# Patient Record
Sex: Male | Born: 1995 | Race: Black or African American | Hispanic: No | Marital: Single | Smoking: Never smoker
Health system: Southern US, Community
[De-identification: ages and names within clinical notes are randomized; demographics above are authoritative.]

---
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
## 2016-09-28 ENCOUNTER — Ambulatory Visit (HOSPITAL_COMMUNITY)
Admission: RE | Admit: 2016-09-28 | Discharge: 2016-09-28 | Disposition: A | Discharge status: Home / Self Care | Payer: Self-pay | Attending: Psychiatry | Admitting: Psychiatry

## 2016-09-28 DIAGNOSIS — F333 Major depressive disorder, recurrent, severe with psychotic symptoms: Secondary | ICD-10-CM | POA: Insufficient documentation

## 2016-09-29 ENCOUNTER — Emergency Department (HOSPITAL_COMMUNITY)
Admission: EM | Admit: 2016-09-29 | Discharge: 2016-09-29 | Disposition: A | Discharge status: Other Acute Inpatient Hospital | Payer: 59 | Source: Home / Self Care | Attending: Emergency Medicine | Admitting: Emergency Medicine

## 2016-09-29 ENCOUNTER — Encounter (HOSPITAL_COMMUNITY): Payer: Self-pay | Admitting: *Deleted

## 2016-09-29 ENCOUNTER — Inpatient Hospital Stay (HOSPITAL_COMMUNITY)
Admission: AD | Admit: 2016-09-29 | Discharge: 2016-10-03 | DRG: 885 | Disposition: A | Discharge status: Home / Self Care | Payer: 59 | Source: Intra-hospital | Attending: Psychiatry | Admitting: Psychiatry

## 2016-09-29 ENCOUNTER — Encounter (HOSPITAL_COMMUNITY): Payer: Self-pay

## 2016-09-29 DIAGNOSIS — G4700 Insomnia, unspecified: Secondary | ICD-10-CM | POA: Diagnosis present

## 2016-09-29 DIAGNOSIS — F332 Major depressive disorder, recurrent severe without psychotic features: Principal | ICD-10-CM | POA: Diagnosis present

## 2016-09-29 DIAGNOSIS — F419 Anxiety disorder, unspecified: Secondary | ICD-10-CM | POA: Diagnosis present

## 2016-09-29 DIAGNOSIS — R45851 Suicidal ideations: Secondary | ICD-10-CM

## 2016-09-29 DIAGNOSIS — F329 Major depressive disorder, single episode, unspecified: Secondary | ICD-10-CM | POA: Diagnosis present

## 2016-09-29 DIAGNOSIS — F4310 Post-traumatic stress disorder, unspecified: Secondary | ICD-10-CM | POA: Diagnosis present

## 2016-09-29 LAB — COMPREHENSIVE METABOLIC PANEL
ALT: 20 U/L (ref 17–63)
AST: 31 U/L (ref 15–41)
Albumin: 4.5 g/dL (ref 3.5–5.0)
Alkaline Phosphatase: 72 U/L (ref 38–126)
Anion gap: 8 (ref 5–15)
BILIRUBIN TOTAL: 0.8 mg/dL (ref 0.3–1.2)
BUN: 30 mg/dL — AB (ref 6–20)
CO2: 26 mmol/L (ref 22–32)
CREATININE: 0.99 mg/dL (ref 0.61–1.24)
Calcium: 9.5 mg/dL (ref 8.9–10.3)
Chloride: 103 mmol/L (ref 101–111)
Glucose, Bld: 91 mg/dL (ref 65–99)
Potassium: 4.2 mmol/L (ref 3.5–5.1)
Sodium: 137 mmol/L (ref 135–145)
TOTAL PROTEIN: 7.8 g/dL (ref 6.5–8.1)

## 2016-09-29 LAB — CBC
HCT: 41.4 % (ref 39.0–52.0)
Hemoglobin: 13.6 g/dL (ref 13.0–17.0)
MCH: 25.5 pg — AB (ref 26.0–34.0)
MCHC: 32.9 g/dL (ref 30.0–36.0)
MCV: 77.7 fL — AB (ref 78.0–100.0)
PLATELETS: 207 10*3/uL (ref 150–400)
RBC: 5.33 MIL/uL (ref 4.22–5.81)
RDW: 13 % (ref 11.5–15.5)
WBC: 4.7 10*3/uL (ref 4.0–10.5)

## 2016-09-29 LAB — RAPID URINE DRUG SCREEN, HOSP PERFORMED
AMPHETAMINES: NOT DETECTED
Barbiturates: NOT DETECTED
Benzodiazepines: NOT DETECTED
Cocaine: NOT DETECTED
Opiates: NOT DETECTED
Tetrahydrocannabinol: NOT DETECTED

## 2016-09-29 LAB — ACETAMINOPHEN LEVEL: Acetaminophen (Tylenol), Serum: <10 ug/mL — ABNORMAL LOW (ref 10–30)

## 2016-09-29 LAB — SALICYLATE LEVEL: Salicylate Lvl: <7 mg/dL (ref 2.8–30.0)

## 2016-09-29 LAB — ETHANOL

## 2016-09-29 MED ORDER — MAGNESIUM HYDROXIDE 400 MG/5ML PO SUSP: 30.0000 mL | Freq: Every day | ORAL | Status: DC | PRN

## 2016-09-29 MED ORDER — TRAZODONE HCL 50 MG PO TABS
50.0000 mg | ORAL_TABLET | Freq: Every evening | ORAL | Status: DC | PRN
  Administered 2016-09-29 – 2016-10-02 (×4): 50 mg via ORAL
  Filled 2016-09-29 (×4): qty 1

## 2016-09-29 MED ORDER — ALUM & MAG HYDROXIDE-SIMETH 200-200-20 MG/5ML PO SUSP: 30.0000 mL | ORAL | Status: DC | PRN

## 2016-09-29 MED ORDER — ONDANSETRON HCL 4 MG PO TABS: 4.0000 mg | ORAL_TABLET | Freq: Three times a day (TID) | ORAL | Status: DC | PRN

## 2016-09-29 MED ORDER — ACETAMINOPHEN 325 MG PO TABS
650.0000 mg | ORAL_TABLET | ORAL | Status: DC | PRN
  Administered 2016-09-29: 650 mg via ORAL
  Filled 2016-09-29: qty 2

## 2016-09-29 MED ORDER — FLUOXETINE HCL 10 MG PO CAPS
10.0000 mg | ORAL_CAPSULE | Freq: Every day | ORAL | Status: DC
  Administered 2016-09-29: 10 mg via ORAL
  Filled 2016-09-29: qty 1

## 2016-09-29 MED ORDER — ACETAMINOPHEN 325 MG PO TABS: 650.0000 mg | ORAL_TABLET | ORAL | Status: DC | PRN

## 2016-09-29 MED ORDER — IBUPROFEN 200 MG PO TABS: 600.0000 mg | ORAL_TABLET | Freq: Three times a day (TID) | ORAL | Status: DC | PRN

## 2016-09-29 MED ORDER — IBUPROFEN 600 MG PO TABS: 600.0000 mg | ORAL_TABLET | Freq: Three times a day (TID) | ORAL | Status: DC | PRN

## 2016-09-29 MED ORDER — FLUOXETINE HCL 10 MG PO CAPS
10.0000 mg | ORAL_CAPSULE | Freq: Every day | ORAL | Status: DC
  Administered 2016-09-30 – 2016-10-03 (×4): 10 mg via ORAL
  Filled 2016-09-29 (×6): qty 1

## 2016-09-29 MED ORDER — TRAZODONE HCL 50 MG PO TABS: 50.0000 mg | ORAL_TABLET | Freq: Every evening | ORAL | Status: DC | PRN

## 2016-09-29 MED ORDER — ZOLPIDEM TARTRATE 5 MG PO TABS: 5.0000 mg | ORAL_TABLET | Freq: Every evening | ORAL | Status: DC | PRN

## 2016-09-29 NOTE — Progress Notes (Signed)
D: Patient denies SI/HI and A/V hallucinations; patient reports feeling sad; patient reports " I came because my sister made me"; patient reports I don't sleep at night, and I have thoughts to hurt myself at night  A: Admission completed; fluids and food offered  R: Patient sad and quiet; eye contact is brief; patient ate dinner; patient contracts for safety

## 2016-09-29 NOTE — ED Notes (Signed)
Tom CSW into see 

## 2016-09-29 NOTE — BH Assessment (Signed)
[facility] Assessment Progress Note  Per Thedore Mins, MD, this pt requires psychiatric hospitalization at this time.  Malva Limes, RN, [facility] has assigned pt to [facility] Rm 400-1; they will be ready to receive pt at 16:00.  Pt has signed Voluntary Admission and Consent for Treatment, as well as Consent to Release Information to his father and his sister, and signed forms have been faxed to [facility].  Pt's nurse, Wille Celeste, has been notified, and agrees to send original paperwork along with pt via Juel Burrow, and to call report to [phone].  Doylene Canning, MA Triage Specialist [phone]

## 2016-09-29 NOTE — H&P (Signed)
Behavioral Health Medical Screening Exam  Robert Sosa is an 21 y.o. male accompanied with his sister and father requesting psychiatric evaluation due to pervasive, exacerbated depressive symptoms, crying, anhedonia, hypersomnia and lack of motivation. Patient is denying nay specific triggers. He is endorsing SI with plan, intent and means. He is denying any HI, AVH, paranoia or delusional thoughts. He is denying acute and chronic co-morbid conditions.  Total Time spent with patient: 20 minutes  Psychiatric Specialty Exam: Physical Exam  Constitutional: He is oriented to person, place, and time. He appears well-developed and well-nourished.  HENT:  Head: Normocephalic.  Respiratory: Effort normal and breath sounds normal.  Neurological: He is alert and oriented to person, place, and time. No cranial nerve deficit.  Skin: Skin is warm and dry.  Psychiatric: His speech is normal. Cognition and memory are impaired. He expresses impulsivity. He exhibits a depressed mood. He expresses suicidal ideation. He expresses suicidal plans.    Review of Systems  Psychiatric/Behavioral: Positive for depression, substance abuse and suicidal ideas. Negative for hallucinations. The patient is not nervous/anxious and does not have insomnia.   All other systems reviewed and are negative.   There were no vitals taken for this visit.There is no height or weight on file to calculate BMI.  General Appearance: Casual  Eye Contact:  Minimal  Speech:  Clear and Coherent  Volume:  Decreased  Mood:  Depressed  Affect:  Congruent  Thought Process:  Goal Directed  Orientation:  Full (Time, Place, and Person)  Thought Content:  Negative  Suicidal Thoughts:  Yes.  with intent/plan  Homicidal Thoughts:  No  Memory:  Immediate;   Fair  Judgement:  Poor  Insight:  Lacking  Psychomotor Activity:  Negative  Concentration: Concentration: Good  Recall:  Fair  Fund of Knowledge:Fair  Language: Good  Akathisia:   Negative  Handed:  Right  AIMS (if indicated):     Assets:  Desire for Improvement  Sleep:       Musculoskeletal: Strength & Muscle Tone: within normal limits Gait & Station: normal Patient leans: N/A  There were no vitals taken for this visit.  Recommendations:  Based on my evaluation the patient does not appear to have an emergency medical condition.  Kerry Hough, PA-C 09/29/2016, 12:21 AM

## 2016-09-29 NOTE — Tx Team (Addendum)
Initial Treatment Plan 09/29/2016 6:09 PM Acheron Sugg ZOX:[id number]    PATIENT STRESSORS: Other: sleep  Suicidal Ideation   PATIENT STRENGTHS: Average or above average intelligence General fund of knowledge Physical Health Supportive family/friends Work skills   PATIENT IDENTIFIED PROBLEMS: "Suicidal Ideation"  "insomnia"  "anxiety"                 DISCHARGE CRITERIA:  Improved stabilization in mood, thinking, and/or behavior Need for constant or close observation no longer present  PRELIMINARY DISCHARGE PLAN: Return to previous living arrangement Return to previous work or school arrangements  PATIENT/FAMILY INVOLVEMENT: This treatment plan has been presented to and reviewed with the patient, Robert Sosa.  The patient and family have been given the opportunity to ask questions and make suggestions.  Barton Fanny, RN 09/29/2016, 6:09 PM

## 2016-09-29 NOTE — ED Notes (Signed)
Visitor into see 

## 2016-09-29 NOTE — ED Notes (Signed)
Pt presents with SI, plan and to hang self with rope.  Pt admits to auditory hallucinations.  Denies HI or visual hallucinations.  Pt reports he works to keep himself busy.  Complaint of depression in the past.  A&O x 3, no distress noted, calm & cooperative.  Monitoring for safety, Q 15 min checks in effect.  Safety check for contraband completed, no items found.

## 2016-09-29 NOTE — Progress Notes (Signed)
Nursing Progress Note: 7p-7a D: Pt currently presents with a depressed/sad affect and behavior. Pt states "I only have thoughts of hurting myself only when I can't sleep." Interacting appropriately with milieu. Pt reports off and on sleep at home.   A: Pt provided with medications per providers orders. Pt's labs and vitals were monitored throughout the night. Pt supported emotionally and encouraged to express concerns and questions. Pt educated on medications.  R: Pt's safety ensured with 15 minute and environmental checks. Pt currently denies SI/HI/Self Harm and AVH. Pt verbally contracts to seek staff if SI/HI or A/VH occurs and to consult with staff before acting on any harmful thoughts. Will continue to monitor.
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
## 2016-09-29 NOTE — Progress Notes (Signed)
[facility] Group Notes:  (Nursing/MHT/Case Management/Adjunct)  Date:  09/29/2016  Time:  9:53 PM  Type of Therapy:  Psychoeducational Skills  Participation Level:  Minimal  Participation Quality:  Inattentive  Affect:  Blunted  Cognitive:  Appropriate  Insight:  Good  Engagement in Group:  Limited  Modes of Intervention:  Education  Summary of Progress/Problems: The patient expressed in group that his father called him today but would not divulge any additional details about his day. His goal for tomorrow is to address his anger issues.   Hazle Coca S 09/29/2016, 9:53 PM

## 2016-09-29 NOTE — ED Notes (Signed)
On the phone 

## 2016-09-29 NOTE — ED Provider Notes (Signed)
[facility]-EMERGENCY DEPT Provider Note   CSN: [id number] Arrival date & time: 09/29/16  0105     History   Chief Complaint Chief Complaint  Patient presents with  . Suicidal    HPI Robert Sosa is a 21 y.o. male.  HPI Patient presents to the emergency department with suicidal ideation.  The patient attempted to hang himself yesterday.  He states that he has been dealing with these thoughts for quite a while, but cannot give me a specific timeframe.  He also has a history of depression that has been ongoing for quite a while.  Patient denies any hallucinationsThe patient denies chest pain, shortness of breath, headache,blurred vision, neck pain, fever, cough, weakness, numbness, dizziness, anorexia, edema, abdominal pain, nausea, vomiting, diarrhea, rash, back pain, dysuria, hematemesis, bloody stool, near syncope, or syncope.  Patient states that he is not on any medications for anxiety or depression History reviewed. No pertinent past medical history.  There are no active problems to display for this patient.   History reviewed. No pertinent surgical history.     Home Medications    Prior to Admission medications   Medication Sig Start Date End Date Taking? Authorizing Provider  ibuprofen (ADVIL,MOTRIN) 600 MG tablet Take 1 tablet (600 mg total) by mouth every 6 (six) hours as needed for pain. Patient not taking: Reported on 09/29/2016 10/24/12   Elson Areas, PA-C    Family History History reviewed. No pertinent family history.  Social History Social History  Substance Use Topics  . Smoking status: Never Smoker  . Smokeless tobacco: Not on file  . Alcohol use No     Allergies   Patient has no known allergies.   Review of Systems Review of Systems All other systems negative except as documented in the HPI. All pertinent positives and negatives as reviewed in the HPI.  Physical Exam Updated Vital Signs BP 121/68 (BP Location: Left Arm)   Pulse (!) 53   Temp  98.2 F (36.8 C) (Oral)   Resp 18   SpO2 100%   Physical Exam  Constitutional: He is oriented to person, place, and time. He appears well-developed and well-nourished. No distress.  HENT:  Head: Normocephalic and atraumatic.  Mouth/Throat: Oropharynx is clear and moist.  Eyes: Pupils are equal, round, and reactive to light.  Neck: Normal range of motion. Neck supple.  Cardiovascular: Normal rate, regular rhythm and normal heart sounds.  Exam reveals no gallop and no friction rub.   No murmur heard. Pulmonary/Chest: Effort normal and breath sounds normal. No respiratory distress. He has no wheezes.  Abdominal: Soft. Bowel sounds are normal. He exhibits no distension. There is no tenderness.  Neurological: He is alert and oriented to person, place, and time. He exhibits normal muscle tone. Coordination normal.  Skin: Skin is warm and dry. Capillary refill takes less than 2 seconds. No rash noted. No erythema.  Psychiatric: He has a normal mood and affect. His behavior is normal.  Nursing note and vitals reviewed.    ED Treatments / Results  Labs (all labs ordered are listed, but only abnormal results are displayed) Labs Reviewed  COMPREHENSIVE METABOLIC PANEL - Abnormal; Notable for the following:       Result Value   BUN 30 (*)    All other components within normal limits  ACETAMINOPHEN LEVEL - Abnormal; Notable for the following:    Acetaminophen (Tylenol), Serum <10 (*)    All other components within normal limits  CBC - Abnormal; Notable for  the following:    MCV 77.7 (*)    MCH 25.5 (*)    All other components within normal limits  ETHANOL  SALICYLATE LEVEL  RAPID URINE DRUG SCREEN, HOSP PERFORMED    EKG  EKG Interpretation None       Radiology No results found.  Procedures Procedures (including critical care time)  Medications Ordered in ED Medications  acetaminophen (TYLENOL) tablet 650 mg (not administered)  ibuprofen (ADVIL,MOTRIN) tablet 600 mg (not  administered)  zolpidem (AMBIEN) tablet 5 mg (not administered)  ondansetron (ZOFRAN) tablet 4 mg (not administered)  alum & mag hydroxide-simeth (MAALOX/MYLANTA) 200-200-20 MG/5ML suspension 30 mL (not administered)     Initial Impression / Assessment and Plan / ED Course  I have reviewed the triage vital signs and the nursing notes.  Pertinent labs & imaging results that were available during my care of the patient were reviewed by me and considered in my medical decision making (see chart for details).     Patient will need TTS assessment based on the fact that he wanted to hang himself and has what sounds like undiagnosed depression  Final Clinical Impressions(s) / ED Diagnoses   Final diagnoses:  None    New Prescriptions New Prescriptions   No medications on file     Charlestine Night, PA-C 09/29/16 1610    Zadie Rhine, MD 10/01/16 [phone]

## 2016-09-29 NOTE — ED Notes (Signed)
Pt ambulatory w/o difficulty with pehlam to [facility], belongings given to driver.

## 2016-09-29 NOTE — ED Notes (Signed)
Up to the bathroom 

## 2016-09-29 NOTE — ED Triage Notes (Signed)
Pt reports having thoughts of using a rope to hang himself yesterday. Pt reports increased SI. Pt report AH. Pt denies VH. Pt denies HI. Pt A+OX4, cooperative, speaking in complete sentences accompanied by father.

## 2016-09-29 NOTE — ED Notes (Signed)
Pt's sister into see 

## 2016-09-29 NOTE — Progress Notes (Signed)
09/29/16 1354:  LRT went to pt room to offer activities, pt was sleep.  Sofia Jaquith, LRT/CTRS 

## 2016-09-29 NOTE — ED Notes (Signed)
Bed: WTR5 Expected date:  Expected time:  Means of arrival:  Comments: 

## 2016-09-30 DIAGNOSIS — F332 Major depressive disorder, recurrent severe without psychotic features: Principal | ICD-10-CM

## 2016-09-30 MED ORDER — ENSURE ENLIVE PO LIQD
237.0000 mL | Freq: Two times a day (BID) | ORAL | Status: DC
  Administered 2016-10-01: 237 mL via ORAL

## 2016-09-30 MED ORDER — HYDROXYZINE HCL 25 MG PO TABS: 25.0000 mg | ORAL_TABLET | Freq: Four times a day (QID) | ORAL | Status: DC | PRN

## 2016-09-30 NOTE — Progress Notes (Signed)
The patient attended the [facility] group and was appropriate.

## 2016-09-30 NOTE — Tx Team (Signed)
Interdisciplinary Treatment and Diagnostic Plan Update  09/30/2016 Time of Session: 0930 Robert Sosa MRN: [id number]  Principal Diagnosis: MDD recurrent, severe  Secondary Diagnoses: Active Problems:   Major depressive disorder, recurrent severe without psychotic features (HCC)   Current Medications:  Current Facility-Administered Medications  Medication Dose Route Frequency Provider Last Rate Last Dose  . acetaminophen (TYLENOL) tablet 650 mg  650 mg Oral Q4H PRN Charm Rings, NP      . alum & mag hydroxide-simeth (MAALOX/MYLANTA) 200-200-20 MG/5ML suspension 30 mL  30 mL Oral Q4H PRN Charm Rings, NP      . FLUoxetine (PROZAC) capsule 10 mg  10 mg Oral Daily Charm Rings, NP   10 mg at 09/30/16 0807  . ibuprofen (ADVIL,MOTRIN) tablet 600 mg  600 mg Oral Q8H PRN Charm Rings, NP      . magnesium hydroxide (MILK OF MAGNESIA) suspension 30 mL  30 mL Oral Daily PRN Charm Rings, NP      . ondansetron [facility]) tablet 4 mg  4 mg Oral Q8H PRN Charm Rings, NP      . traZODone (DESYREL) tablet 50 mg  50 mg Oral QHS PRN Charm Rings, NP   50 mg at 09/29/16 2151   PTA Medications: Prescriptions Prior to Admission  Medication Sig Dispense Refill Last Dose  . ibuprofen (ADVIL,MOTRIN) 600 MG tablet Take 1 tablet (600 mg total) by mouth every 6 (six) hours as needed for pain. (Patient not taking: Reported on 09/29/2016) 16 tablet 0 Not Taking at Unknown time    Patient Stressors: Other: sleep  Patient Strengths: Average or above average intelligence General fund of knowledge Physical Health Supportive family/friends Work skills  Treatment Modalities: Medication Management, Group therapy, Case management,  1 to 1 session with clinician, Psychoeducation, Recreational therapy.   Physician Treatment Plan for Primary Diagnosis: MDD recurrent, severe  Medication Management: Evaluate patient's response, side effects, and tolerance of medication regimen.  Therapeutic  Interventions: 1 to 1 sessions, Unit Group sessions and Medication administration.  Evaluation of Outcomes: Progressing  Physician Treatment Plan for Secondary Diagnosis: Active Problems:   Major depressive disorder, recurrent severe without psychotic features (HCC)  Long Term Goal(s):     Short Term Goals:       Medication Management: Evaluate patient's response, side effects, and tolerance of medication regimen.  Therapeutic Interventions: 1 to 1 sessions, Unit Group sessions and Medication administration.  Evaluation of Outcomes: Progressing   RN Treatment Plan for Primary Diagnosis: MDD recurrent, severe Long Term Goal(s): Knowledge of disease and therapeutic regimen to maintain health will improve  Short Term Goals: Ability to remain free from injury will improve, Ability to verbalize feelings will improve and Ability to disclose and discuss suicidal ideas  Medication Management: RN will administer medications as ordered by provider, will assess and evaluate patient's response and provide education to patient for prescribed medication. RN will report any adverse and/or side effects to prescribing provider.  Therapeutic Interventions: 1 on 1 counseling sessions, Psychoeducation, Medication administration, Evaluate responses to treatment, Monitor vital signs and CBGs as ordered, Perform/monitor CIWA, COWS, AIMS and Fall Risk screenings as ordered, Perform wound care treatments as ordered.  Evaluation of Outcomes: Progressing   LCSW Treatment Plan for Primary Diagnosis:MDD recurrent, severe Long Term Goal(s): Safe transition to appropriate next level of care at discharge, Engage patient in therapeutic group addressing interpersonal concerns.  Short Term Goals: Engage patient in aftercare planning with referrals and resources, Facilitate patient progression through  stages of change regarding substance use diagnoses and concerns and Identify triggers associated with mental  health/substance abuse issues  Therapeutic Interventions: Assess for all discharge needs, 1 to 1 time with Social worker, Explore available resources and support systems, Assess for adequacy in community support network, Educate family and significant other(s) on suicide prevention, Complete Psychosocial Assessment, Interpersonal group therapy.  Evaluation of Outcomes: Progressing   Progress in Treatment: Attending groups: No. New to unit. Continuing to assess.  Participating in groups: No. Taking medication as prescribed: Yes. Toleration medication: Yes. Family/Significant other contact made: No, will contact:  family member/sister if patient consents Patient understands diagnosis: Yes. Discussing patient identified problems/goals with staff: Yes. Medical problems stabilized or resolved: Yes. Denies suicidal/homicidal ideation: Yes. Issues/concerns per patient self-inventory: No. Other: n/a   New problem(s) identified: No, Describe:  n/a  New Short Term/Long Term Goal(s): elimination of AVH and SI thoughts; medication stabilization; development of comprehensive mental wellness plan.   Discharge Plan or Barriers: CSW assessing for appropriate referrals. Pt has no current outpatient providers and lives with his sister in [place].   Reason for Continuation of Hospitalization: Anxiety Depression Hallucinations Medication stabilization Suicidal ideation  Estimated Length of Stay:  Attendees: Patient: 09/30/2016 1:49 PM  Physician: Dr. Jackquline Berlin MD 09/30/2016 1:49 PM  Nursing: Dennie Bible, RN 09/30/2016 1:49 PM  RN Care Manager: Onnie Boer CM 09/30/2016 1:49 PM  Social Worker: Trula Slade, LCSW 09/30/2016 1:49 PM  Recreational Therapist: Juliann Pares 09/30/2016 1:49 PM  Other: Armandina Stammer NP; Gray Bernhardt NP 09/30/2016 1:49 PM  Other:  09/30/2016 1:49 PM  Other: 09/30/2016 1:49 PM    Scribe for Treatment Team: Ledell Peoples Smart, LCSW 09/30/2016 1:49 PM
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
## 2016-09-30 NOTE — BHH Group Notes (Signed)
[facility] Group Notes:  (Nursing/MHT/Case Management/Adjunct)  Date:  09/30/2016  Time:  0900  Type of Therapy:  Nurse Education  - Lifestyle and Leisure Changes  Participation Level:  Minimal  Participation Quality:  Attentive  Affect:  Blunted  Cognitive:  Alert  Insight:  Limited  Engagement in Group:  Limited  Modes of Intervention:  Education  Summary of Progress/Problems: Patient attended nursing education group though participation was minimal.  Lawrence Marseilles 09/30/2016, [phone]

## 2016-09-30 NOTE — BHH Group Notes (Signed)
[facility] Group Notes:  (Nursing/MHT/Case Management/Adjunct)  Date:  09/30/2016  Time:  0900  Type of Therapy:  Nurse Education  Participation Level:  Did Not Attend  Participation Quality:     Affect:    Cognitive:    Insight:    Engagement in Group:    Modes of Intervention:    Summary of Progress/Problems:  Lawrence Marseilles 09/30/2016, [phone]

## 2016-09-30 NOTE — B[facility] Group Notes (Signed)
[facility] LCSW Group Therapy 09/30/2016 1:15pm  Pt did not attend, declined invitation.    Vernie Shanks, LCSW 09/30/2016 2:58 PM

## 2016-09-30 NOTE — Progress Notes (Signed)
D: Patient up and visible in the milieu. Spoke with patient 1:1. Rating depression at a 4/10, hopelessness at a 7/10 and anxiety at a 0/10. Rates sleep as good, appetite as good, energy as normal and concentration as good. Patient's affect flat, sad with depressed mood. States goal for today is to "be less sad and to learn how to cope with my depression, participate in group." Denies pain, physical problems.   A: Medicated per orders, no prns requested or required. Emotional support offered and self inventory reviewed. Encouraged completion of Suicide Safety Plan as he progresses through his stay here. Discussed POC with MD, SW.    R: Patient verbalizes understanding of POC. When asked about AVH patient states, "not today" but then goes on to say, "well, they are more like flashbacks of the past. Not really hallucinations." Patient denies SI/HI and remains safe on level III obs. Verbal contract for safety in place.

## 2016-09-30 NOTE — [facility] Suicide Risk Assessment (Signed)
[facility] Admission Suicide Risk Assessment   Nursing information obtained from:  Patient Demographic factors:  Male, Adolescent or young adult, Access to firearms Current Mental Status:  Self-harm thoughts Loss Factors:  NA Historical Factors:  Prior suicide attempts Risk Reduction Factors:  Sense of responsibility to family, Positive social support  Total Time spent with patient: 45 minutes Principal Problem:  MDD  Diagnosis:   Patient Active Problem List   Diagnosis Date Noted  . Major depressive disorder, recurrent severe without psychotic features (HCC) [F33.2] 09/29/2016    Continued Clinical Symptoms:  Alcohol Use Disorder Identification Test Final Score (AUDIT): 0 The "Alcohol Use Disorders Identification Test", Guidelines for Use in Primary Care, Second Edition.  World Science writer [facility]). Score between 0-7:  no or low risk or alcohol related problems. Score between 8-15:  moderate risk of alcohol related problems. Score between 16-19:  high risk of alcohol related problems. Score 20 or above:  warrants further diagnostic evaluation for alcohol dependence and treatment.   CLINICAL FACTORS:  21 year old single male, presents for worsening depression, (+) neuro-vegetative symptoms of depression, recent suicidal ideations, with recent thoughts of hanging self . Came to ED voluntarily at encouragement of his family . No prior psychiatric admissions, one prior suicidal attempt at age 42.    Psychiatric Specialty Exam: Physical Exam  ROS  Blood pressure 120/85, pulse 77, temperature 97.7 F (36.5 C), temperature source Oral, resp. rate 18, height  (1.778 m), weight 59.4 kg (131 lb), SpO2 99 %.Body mass index is 18.8 kg/m.   see admit note MSE    COGNITIVE FEATURES THAT CONTRIBUTE TO RISK:  Closed-mindedness and Loss of executive function    SUICIDE RISK:   Moderate:  Frequent suicidal ideation with limited intensity, and duration, some specificity in terms of  plans, no associated intent, good self-control, limited dysphoria/symptomatology, some risk factors present, and identifiable protective factors, including available and accessible social support.  PLAN OF CARE: Patient will be admitted to inpatient psychiatric unit for stabilization and safety. Will provide and encourage milieu participation. Provide medication management and maked adjustments as needed.  Will follow daily.    I certify that inpatient services furnished can reasonably be expected to improve the patient's condition.   Craige Cotta, MD 09/30/2016, 4:36 PM

## 2016-10-01 LAB — TSH: TSH: 3.635 u[IU]/mL (ref 0.350–4.500)

## 2016-10-01 NOTE — Plan of Care (Signed)
Problem: Safety: Goal: Periods of time without injury will increase Outcome: Progressing Patient has not engaged in self harm. Denies SI at present.  Problem: Medication: Goal: Compliance with prescribed medication regimen will improve Outcome: Progressing Patient is med compliant.

## 2016-10-01 NOTE — Progress Notes (Signed)
After receiving report from pt, introduced self to pt in dayroom and urged him to voice needs/concerns/questions. Pt remains safe with 15-minute checks in place.

## 2016-10-01 NOTE — Tx Team (Signed)
Interdisciplinary Treatment and Diagnostic Plan Update  10/01/2016 Time of Session: 0930 Robert Sosa MRN: [id number]  Principal Diagnosis: MDD recurrent, severe  Secondary Diagnoses: Active Problems:   Major depressive disorder, recurrent severe without psychotic features ([place])   Current Medications:  Current Facility-Administered Medications  Medication Dose Route Frequency Provider Last Rate Last Dose  . acetaminophen (TYLENOL) tablet 650 mg  650 mg Oral Q4H PRN Patrecia Pour, NP      . alum & mag hydroxide-simeth (MAALOX/MYLANTA) 200-200-20 MG/5ML suspension 30 mL  30 mL Oral Q4H PRN Patrecia Pour, NP      . feeding supplement (ENSURE ENLIVE) (ENSURE ENLIVE) liquid 237 mL  237 mL Oral BID BM Myer Peer Cobos, MD   237 mL at 10/01/16 1339  . FLUoxetine (PROZAC) capsule 10 mg  10 mg Oral Daily Patrecia Pour, NP   10 mg at 10/01/16 0748  . hydrOXYzine (ATARAX/VISTARIL) tablet 25 mg  25 mg Oral Q6H PRN Jenne Campus, MD      . ibuprofen (ADVIL,MOTRIN) tablet 600 mg  600 mg Oral Q8H PRN Patrecia Pour, NP      . magnesium hydroxide (MILK OF MAGNESIA) suspension 30 mL  30 mL Oral Daily PRN Patrecia Pour, NP      . ondansetron [facility]) tablet 4 mg  4 mg Oral Q8H PRN Patrecia Pour, NP      . traZODone (DESYREL) tablet 50 mg  50 mg Oral QHS PRN Patrecia Pour, NP   50 mg at 09/30/16 2354   PTA Medications: Prescriptions Prior to Admission  Medication Sig Dispense Refill Last Dose  . ibuprofen (ADVIL,MOTRIN) 600 MG tablet Take 1 tablet (600 mg total) by mouth every 6 (six) hours as needed for pain. (Patient not taking: Reported on 09/29/2016) 16 tablet 0 Not Taking at Unknown time    Patient Stressors: Other: sleep  Patient Strengths: Average or above average intelligence General fund of knowledge Physical Health Supportive family/friends Work skills  Treatment Modalities: Medication Management, Group therapy, Case management,  1 to 1 session with clinician, Psychoeducation,  Recreational therapy.   Physician Treatment Plan for Primary Diagnosis: MDD recurrent, severe  Medication Management: Evaluate patient's response, side effects, and tolerance of medication regimen.  Therapeutic Interventions: 1 to 1 sessions, Unit Group sessions and Medication administration.  Evaluation of Outcomes: Met  Physician Treatment Plan for Secondary Diagnosis: Active Problems:   Major depressive disorder, recurrent severe without psychotic features ([place])  Long Term Goal(s): Improvement in symptoms so as ready for discharge Improvement in symptoms so as ready for discharge   Short Term Goals: Ability to verbalize feelings will improve Ability to disclose and discuss suicidal ideas Ability to demonstrate self-control will improve Ability to identify and develop effective coping behaviors will improve Ability to maintain clinical measurements within normal limits will improve Ability to verbalize feelings will improve Ability to disclose and discuss suicidal ideas Ability to demonstrate self-control will improve Ability to identify and develop effective coping behaviors will improve Ability to maintain clinical measurements within normal limits will improve     Medication Management: Evaluate patient's response, side effects, and tolerance of medication regimen.  Therapeutic Interventions: 1 to 1 sessions, Unit Group sessions and Medication administration.  Evaluation of Outcomes: Met  RN Treatment Plan for Primary Diagnosis: MDD recurrent, severe Long Term Goal(s): Knowledge of disease and therapeutic regimen to maintain health will improve  Short Term Goals: Ability to remain free from injury will improve, Ability to verbalize  feelings will improve and Ability to disclose and discuss suicidal ideas  Medication Management: RN will administer medications as ordered by provider, will assess and evaluate patient's response and provide education to patient for prescribed  medication. RN will report any adverse and/or side effects to prescribing provider.  Therapeutic Interventions: 1 on 1 counseling sessions, Psychoeducation, Medication administration, Evaluate responses to treatment, Monitor vital signs and CBGs as ordered, Perform/monitor CIWA, COWS, AIMS and Fall Risk screenings as ordered, Perform wound care treatments as ordered.  Evaluation of Outcomes: Met   LCSW Treatment Plan for Primary Diagnosis:MDD recurrent, severe Long Term Goal(s): Safe transition to appropriate next level of care at discharge, Engage patient in therapeutic group addressing interpersonal concerns.  Short Term Goals: Engage patient in aftercare planning with referrals and resources, Facilitate patient progression through stages of change regarding substance use diagnoses and concerns and Identify triggers associated with mental health/substance abuse issues  Therapeutic Interventions: Assess for all discharge needs, 1 to 1 time with Social worker, Explore available resources and support systems, Assess for adequacy in community support network, Educate family and significant other(s) on suicide prevention, Complete Psychosocial Assessment, Interpersonal group therapy.  Evaluation of Outcomes: Met   Progress in Treatment: Attending groups: Yes Participating in groups: Yes Taking medication as prescribed: Yes. Toleration medication: Yes. Family/Significant other contact made: SPE Completed with pt's sister.  Patient understands diagnosis: Yes. Discussing patient identified problems/goals with staff: Yes. Medical problems stabilized or resolved: Yes. Denies suicidal/homicidal ideation: Yes. Issues/concerns per patient self-inventory: No. Other: n/a   New problem(s) identified: No, Describe:  n/a  New Short Term/Long Term Goal(s): elimination of AVH and SI thoughts; medication stabilization; development of comprehensive mental wellness plan.   Discharge Plan or Barriers: Pt  referred to [place] for medication management and individual counseling.   Reason for Continuation of Hospitalization: medication management   Estimated Length of Stay: 1 day (tentative d/c on Saturday) per Dr. Parke Poisson.   Attendees: Patient: 10/01/2016 2:56 PM  Physician: Dr. Parke Poisson MD 10/01/2016 2:56 PM  Nursing: Nira Conn RN 10/01/2016 2:56 PM  RN Care Manager: Lars Pinks CM 10/01/2016 2:56 PM  Social Worker: Maxie Better, LCSW 10/01/2016 2:56 PM  Recreational Therapist: Rhunette Croft 10/01/2016 2:56 PM  Other: Lindell Spar NP; Ricky Ala NP 10/01/2016 2:56 PM  Other:  10/01/2016 2:56 PM  Other: 10/01/2016 2:56 PM    Scribe for Treatment Team: [place], LCSW 10/01/2016 2:56 PM

## 2016-10-01 NOTE — Progress Notes (Addendum)
  [facility] Adult Case Management Discharge Plan :  Will you be returning to the same living situation after discharge:  Yes,  home with sister At discharge, do you have transportation home?: Yes, family member-TENTATIVE discharge scheduled for Saturday if MD is comfortable.  Do you have the ability to pay for your medications: Yes,  [facility]  Release of information consent forms completed and submitted to medical records by CSW.  Patient to Follow up at: Follow-up Information    [facility] Follow up on 10/13/2016.   Specialty:  Behavioral Health Why:  Appt on Wednesday, Oct 13, 2016 for counseling at 11:00AM with Vilma Prader. Patient was offered earlier appt but declined due to work schedule.   Med management appt to be scheduled at this visit. Patient has been pThank you.  Contact information: [address] [place] [address] [phone]           Next level of care provider has access to [facility] Link:no  Safety Planning and Suicide Prevention discussed: Yes,  SPE completed with pt's sister.  Have you used any form of tobacco in the last 30 days? (Cigarettes, Smokeless Tobacco, Cigars, and/or Pipes): No  Has patient been referred to the Quitline?: N/A patient is not a smoker  Patient has been referred for addiction treatment: N/A  Sallee Lange LCSW 10/04/2016, 8:47 AM

## 2016-10-01 NOTE — Progress Notes (Signed)
Pt reports his day has been good.  He was visited by his sister this evening.  He says she is his number one support.  He reports he has been going to groups and participating.  He denies SI/HI/AVH at this time.  Writer reviewed pt's available meds for the evening and informed if he needed a sleep aid, to let staff know.  Pt voiced understanding and voiced no other needs or concerns.  Support and encouragement offered.  Discharge plans are in process.  Pt plans to return to live with his sister and her mother.  Safety maintained with q15 minute checks.

## 2016-10-01 NOTE — Progress Notes (Signed)
D: Patient up and visible in the milieu, coloring in dayroom. Spoke with patient 1:1. Rating depression, hopelessness and anxiety at a 0/10 however patient's affect quite flat, sad and depressed with congruent mood. Rates sleep as good, appetite as good, energy as high and concentration as good. Patient forwards minimal information. States goal for today is to "participate in group." Denies pain, physical problems.   A: Medicated per orders, no prns requested or required. Emotional support offered and self inventory reviewed. Encouraged completion of Suicide Safety Plan. Discussed POC with MD, SW.    R: Patient verbalizes understanding of POC. Patient denies SI/HI and remains safe on level III obs.

## 2016-10-01 NOTE — [facility] Group Notes (Signed)
[facility] LCS[facility] Note   10/01/2016  8:45 AM  Participation Quality: Active  Mood/Affect:  Appropriate  Depression Rating:  Pt denies   Anxiety Rating: Pt denies     Thoughts of Suicide:  No  Current AVH:  No  Plan for Discharge/Comments:  Pt denies all symptoms today and is focused on discharge. Pt states that today is his dad's birthday and he would like to discharge by tomorrow because they have planned to take a trip together to celebrate.   Transportation Means: Pt has access to transporation  Supports: Family, mental health supports   Jonathon Jordan, MSW, [facility] 10/01/2016 3:33 PM

## 2016-10-01 NOTE — [facility] Group Notes (Signed)
[facility] LCSW Group Therapy 10/01/2016 1:15pm  Type of Therapy: Group Therapy- Feelings Around Relapse and Recovery  Participation Level: Pt was present for the entire group but did not engage in discussion. Pt was having a side conversation with another pt at some points during the group, but was easily redirected.   Jonathon Jordan, MSW, [facility] [phone] 10/01/2016 3:35 PM

## 2016-10-01 NOTE — [facility] Suicide Risk Assessment (Signed)
[facility] INPATIENT:  Family/Significant Other Suicide Prevention Education  Suicide Prevention Education:  Education Completed; Jered Heiny (pt's sister) [phone] has been identified by the patient as the family member/significant other with whom the patient will be residing, and identified as the person(s) who will aid the patient in the event of a mental health crisis (suicidal ideations/suicide attempt).  With written consent from the patient, the family member/significant other has been provided the following suicide prevention education, prior to the and/or following the discharge of the patient.  The suicide prevention education provided includes the following:  Suicide risk factors  Suicide prevention and interventions  National Suicide Hotline telephone number  [facility] assessment telephone number  [facility] Emergency Assistance 911  [facility] and/or Residential Mobile Crisis Unit telephone number  Request made of family/significant other to:  Remove weapons (e.g., guns, rifles, knives), all items previously/currently identified as safety concern.    Remove drugs/medications (over-the-counter, prescriptions, illicit drugs), all items previously/currently identified as a safety concern.  The family member/significant other verbalizes understanding of the suicide prevention education information provided.  The family member/significant other agrees to remove the items of safety concern listed above.  Hayden Mabin N Smart LCSW 10/01/2016, 10:44 AM

## 2016-10-01 NOTE — Progress Notes (Signed)
Patient attended group and said that his day was a 8. He was excited to go outside today because he had the opportunity to pay basketball.

## 2016-10-01 NOTE — Progress Notes (Signed)
[facility] MD Progress Note  10/01/2016 5:32 PM Robert Sosa  MRN:  [id number] Subjective:  Patient states he is feeling better . At this time he is focusing more on being discharged soon, and states there is a family trip scheduled for this weekend he would have liked to go to. Denies medication side effects. Objective : I have discussed case with treatment team and have met with patient . He presents partially improved compared to admission, reports mood is improved, and tends to minimize depression or neuro-vegetative symptoms of depression at this time. Denies any suicidal ideations. As discussed with staff, has continued to present with a constricted, sad affect at times, although today it is more reactive Denies medication side effects No behavioral issues on unit, pleasant on approach, vaguely anxious. Principal Problem: MDD  Diagnosis:   Patient Active Problem List   Diagnosis Date Noted  . Major depressive disorder, recurrent severe without psychotic features ([place]) [F33.2] 09/29/2016   Total Time spent with patient: 20 minutes  Past Medical History: History reviewed. No pertinent past medical history. History reviewed. No pertinent surgical history. Family History: History reviewed. No pertinent family history. Social History:  History  Alcohol Use No     History  Drug use: Unknown    Social History   Social History  . Marital status: Single    Spouse name: N/A  . Number of children: N/A  . Years of education: N/A   Social History Main Topics  . Smoking status: Never Smoker  . Smokeless tobacco: Never Used  . Alcohol use No  . Drug use: Unknown  . Sexual activity: Yes    Birth control/ protection: Condom   Other Topics Concern  . None   Social History Narrative  . None   Additional Social History:   Sleep: Fair  Appetite:  Fair- improving   Current Medications: Current Facility-Administered Medications  Medication Dose Route Frequency Provider Last Rate  Last Dose  . acetaminophen (TYLENOL) tablet 650 mg  650 mg Oral Q4H PRN Patrecia Pour, NP      . alum & mag hydroxide-simeth (MAALOX/MYLANTA) 200-200-20 MG/5ML suspension 30 mL  30 mL Oral Q4H PRN Patrecia Pour, NP      . feeding supplement (ENSURE ENLIVE) (ENSURE ENLIVE) liquid 237 mL  237 mL Oral BID BM Myer Peer Porsha Skilton, MD   237 mL at 10/01/16 1339  . FLUoxetine (PROZAC) capsule 10 mg  10 mg Oral Daily Patrecia Pour, NP   10 mg at 10/01/16 0748  . hydrOXYzine (ATARAX/VISTARIL) tablet 25 mg  25 mg Oral Q6H PRN Jenne Campus, MD      . ibuprofen (ADVIL,MOTRIN) tablet 600 mg  600 mg Oral Q8H PRN Patrecia Pour, NP      . magnesium hydroxide (MILK OF MAGNESIA) suspension 30 mL  30 mL Oral Daily PRN Patrecia Pour, NP      . ondansetron [facility]) tablet 4 mg  4 mg Oral Q8H PRN Patrecia Pour, NP      . traZODone (DESYREL) tablet 50 mg  50 mg Oral QHS PRN Patrecia Pour, NP   50 mg at 09/30/16 2354    Lab Results:  Results for orders placed or performed during the hospital encounter of 09/29/16 (from the past 48 hour(s))  TSH     Status: None   Collection Time: 10/01/16  6:02 AM  Result Value Ref Range   TSH 3.635 0.350 - 4.500 uIU/mL    Comment: Performed  by a 3rd Generation assay with a functional sensitivity of <=0.01 uIU/mL. Performed at [facility], [place] [address], [place], [place] [address]     Blood Alcohol level:  Lab Results  Component Value Date   ETH <5 74/94/4967    Metabolic Disorder Labs: No results found for: HGBA1C, MPG No results found for: PROLACTIN No results found for: CHOL, TRIG, HDL, CHOLHDL, VLDL, LDLCALC  Physical Findings: AIMS: Facial and Oral Movements Muscles of Facial Expression: None, normal Lips and Perioral Area: None, normal Jaw: None, normal Tongue: None, normal,Extremity Movements Upper (arms, wrists, hands, fingers): None, normal Lower (legs, knees, ankles, toes): None, normal, Trunk Movements Neck, shoulders, hips:  None, normal, Overall Severity Severity of abnormal movements (highest score from questions above): None, normal Incapacitation due to abnormal movements: None, normal Patient's awareness of abnormal movements (rate only patient's report): No Awareness, Dental Status Current problems with teeth and/or dentures?: No Does patient usually wear dentures?: No  CIWA:    COWS:     Musculoskeletal: Strength & Muscle Tone: within normal limits Gait & Station: normal Patient leans: N/A  Psychiatric Specialty Exam: Physical Exam  ROS denies headache, no chest pain   Blood pressure 113/74, pulse 61, temperature 97.7 F (36.5 C), temperature source Oral, resp. rate 18, height _0  (1.778 m), weight 59.4 kg (131 lb), SpO2 99 %.Body mass index is 18.8 kg/m.  General Appearance: improved grooming  Eye Contact:  Good  Speech:  Normal Rate  Volume:  Decreased  Mood:  reports feeling better, and minimizes current depression  Affect:  still constricted, sad, but does smile at times appropriately   Thought Process:  Linear and Descriptions of Associations: Intact  Orientation:  Full (Time, Place, and Person)  Thought Content:  no halucinations, no delusions  Suicidal Thoughts:  No at this time denies any suicidal or self injurious ideations, contracts for safety on unit, denies any HI  Homicidal Thoughts:  No  Memory:  recent and remote grossly intact   Judgement:  Fair  Insight:  Fair  Psychomotor Activity:  Normal  Concentration:  Concentration: Good and Attention Span: Good  Recall:  Good  Fund of Knowledge:  Good  Language:  Good  Akathisia:  Negative  Handed:  Right  AIMS (if indicated):     Assets:  Communication Skills Desire for Improvement Physical Health Resilience  ADL's:  Intact  Cognition:  WNL  Sleep:  Number of Hours: 5.25    Assessment - patient reports feeling better, less depressed, and at this time denies any suicidal ideations. He is hopeful for discharge soon.  Staff reports he has continued to present with a sad, constricted affect. Denies medication side effects and is tolerating Prozac well thus far .   Treatment Plan Summary: Daily contact with patient to assess and evaluate symptoms and progress in treatment, Medication management, Plan inpatient treatment and medications as below  Encourage ongoing group and milieu participation to work on coping skills and symptom reduction Continue Prozac 10 mgrs QDAY for depression, anxiety- side effects reviewed Continue Vistaril 25 mgrs Q 6 hours PRN for anxiety as needed  Continue Trazodone 50 mgrsd QHS PRN for insomnia as needed  Treatment team working on disposition planning options Consider discharge Sunday IF patient continues to improve and stabilize .  Jenne Campus, MD 10/01/2016, 5:32 PM

## 2016-10-02 DIAGNOSIS — G4700 Insomnia, unspecified: Secondary | ICD-10-CM

## 2016-10-02 DIAGNOSIS — F419 Anxiety disorder, unspecified: Secondary | ICD-10-CM

## 2016-10-02 NOTE — [facility] Group Notes (Signed)
[facility] Group Notes:  (Nursing/MHT/Case Management/Adjunct)  Date:  10/02/2016  Time:  0900 am  Type of Therapy:  Nurse Education  Participation Level:  Minimal  Participation Quality:  Appropriate  Affect:  Blunted  Cognitive:  Alert  Insight:  Good  Engagement in Group:  Engaged  Modes of Intervention:  Support  Summary of Progress/Problems: Patient has minimal interaction with staff, however, he listened attentively.    [place]ranford Mon 10/02/2016, 9:54 AM

## 2016-10-02 NOTE — Progress Notes (Signed)
[facility] MD Progress Note  10/02/2016 1:06 PM Robert Sosa  MRN:  [id number]  Subjective: [place] states he is feeling better today. "I'm ready to go home now", he says.   Objective: Maggie at this time is focusing more on being discharged soon, and states there is a family trip scheduled for this weekend he would have liked to go to. It was also his father's birthday yesterday. Denies medication side effects. I have discussed case with treatment team and have met with patient . He presents improved compared to admission, reports mood is good, and tends to minimize depression or neuro-vegetative symptoms of depression at this time. Denies any suicidal ideations. As discussed with staff, presents with a good affect & it is more reactive. Denies medication side effects No behavioral issues on unit, pleasant on approach, vaguely anxious.  Principal Problem: MDD   Diagnosis:   Patient Active Problem List   Diagnosis Date Noted  . Major depressive disorder, recurrent severe without psychotic features ([place]) [F33.2] 09/29/2016   Total Time spent with patient: 15 minutes  Past Medical History: History reviewed. No pertinent past medical history. History reviewed. No pertinent surgical history.  Family History: History reviewed. No pertinent family history.  Social History:  History  Alcohol Use No     History  Drug use: Unknown    Social History   Social History  . Marital status: Single    Spouse name: N/A  . Number of children: N/A  . Years of education: N/A   Social History Main Topics  . Smoking status: Never Smoker  . Smokeless tobacco: Never Used  . Alcohol use No  . Drug use: Unknown  . Sexual activity: Yes    Birth control/ protection: Condom   Other Topics Concern  . None   Social History Narrative  . None   Additional Social History:   Sleep: Fair  Appetite:  Fair- improving   Current Medications: Current Facility-Administered Medications  Medication Dose Route  Frequency Provider Last Rate Last Dose  . acetaminophen (TYLENOL) tablet 650 mg  650 mg Oral Q4H PRN Patrecia Pour, NP      . alum & mag hydroxide-simeth (MAALOX/MYLANTA) 200-200-20 MG/5ML suspension 30 mL  30 mL Oral Q4H PRN Patrecia Pour, NP      . feeding supplement (ENSURE ENLIVE) (ENSURE ENLIVE) liquid 237 mL  237 mL Oral BID BM Myer Peer Cobos, MD   237 mL at 10/01/16 1339  . FLUoxetine (PROZAC) capsule 10 mg  10 mg Oral Daily Patrecia Pour, NP   10 mg at 10/02/16 8416  . hydrOXYzine (ATARAX/VISTARIL) tablet 25 mg  25 mg Oral Q6H PRN Jenne Campus, MD      . ibuprofen (ADVIL,MOTRIN) tablet 600 mg  600 mg Oral Q8H PRN Patrecia Pour, NP      . magnesium hydroxide (MILK OF MAGNESIA) suspension 30 mL  30 mL Oral Daily PRN Patrecia Pour, NP      . ondansetron [facility]) tablet 4 mg  4 mg Oral Q8H PRN Patrecia Pour, NP      . traZODone (DESYREL) tablet 50 mg  50 mg Oral QHS PRN Patrecia Pour, NP   50 mg at 10/01/16 2136   Lab Results:  Results for orders placed or performed during the hospital encounter of 09/29/16 (from the past 48 hour(s))  TSH     Status: None   Collection Time: 10/01/16  6:02 AM  Result Value Ref Range  TSH 3.635 0.350 - 4.500 uIU/mL    Comment: Performed by a 3rd Generation assay with a functional sensitivity of <=0.01 uIU/mL. Performed at [facility] [place] [address] [place], [place] [address]    Blood Alcohol level:  Lab Results  Component Value Date   ETH <5 14/43/1540   Metabolic Disorder Labs: No results found for: HGBA1C, MPG No results found for: PROLACTIN No results found for: CHOL, TRIG, HDL, CHOLHDL, VLDL, LDLCALC  Physical Findings: AIMS: Facial and Oral Movements Muscles of Facial Expression: None, normal Lips and Perioral Area: None, normal Jaw: None, normal Tongue: None, normal,Extremity Movements Upper (arms, wrists, hands, fingers): None, normal Lower (legs, knees, ankles, toes): None, normal, Trunk  Movements Neck, shoulders, hips: None, normal, Overall Severity Severity of abnormal movements (highest score from questions above): None, normal Incapacitation due to abnormal movements: None, normal Patient's awareness of abnormal movements (rate only patient's report): No Awareness, Dental Status Current problems with teeth and/or dentures?: No Does patient usually wear dentures?: No  CIWA:    COWS:     Musculoskeletal: Strength & Muscle Tone: within normal limits Gait & Station: normal Patient leans: N/A  Psychiatric Specialty Exam: Physical Exam  ROS denies headache, no chest pain   Blood pressure 113/74, pulse 61, temperature 97.7 F (36.5 C), temperature source Oral, resp. rate 18, height '5\' 10"'$  (1.778 m), weight 59.4 kg (131 lb), SpO2 99 %.Body mass index is 18.8 kg/m.  General Appearance: improved grooming  Eye Contact:  Good  Speech:  Normal Rate  Volume:  Decreased  Mood:  reports feeling better, and minimizes current depression  Affect:  still constricted, sad, but does smile at times appropriately   Thought Process:  Linear and Descriptions of Associations: Intact  Orientation:  Full (Time, Place, and Person)  Thought Content:  no halucinations, no delusions  Suicidal Thoughts:  No at this time denies any suicidal or self injurious ideations, contracts for safety on unit, denies any HI  Homicidal Thoughts:  No  Memory:  recent and remote grossly intact   Judgement:  Fair  Insight:  Fair  Psychomotor Activity:  Normal  Concentration:  Concentration: Good and Attention Span: Good  Recall:  Good  Fund of Knowledge:  Good  Language:  Good  Akathisia:  Negative  Handed:  Right  AIMS (if indicated):     Assets:  Communication Skills Desire for Improvement Physical Health Resilience  ADL's:  Intact  Cognition:  WNL  Sleep:  Number of Hours: 6.75   Assessment - patient reports feeling better, less depressed, and at this time denies any suicidal ideations. He is  hopeful for discharge soon. Staff reports he has continued to present with a sad, constricted affect. Denies medication side effects and is tolerating Prozac well thus far .   Treatment Plan Summary: Daily contact with patient to assess and evaluate symptoms and progress in treatment, Medication management, Plan inpatient treatment and medications as below   Will continue today 10/02/16 plan as below except where it is noted.  Encourage ongoing group and milieu participation to work on Radiographer, therapeutic and symptom reduction  Will continue Prozac 10 mgrs QDAY for depression, anxiety- side effects reviewed  Will continue Vistaril 25 mgrs Q 6 hours PRN for anxiety as needed   Will continue Trazodone 50 mgrsd QHS PRN for insomnia as needed   Treatment team working on disposition planning options  Consider discharge Sunday if patient continues to improve and stabilize.   Lindell Spar  I, NP, PMHNP, FNP-BC 10/02/2016, 1:06 PMPatient ID: Robert Sosa, male   DOB: 1996/01/07, 21 y.o.   MRN: [id number]

## 2016-10-02 NOTE — [facility] Group Notes (Signed)
Adult Therapy Group Note  Date:  10/02/2016  Time:  10:00-11:00AM  Group Topic/Focus: Unhealthy vs Healthy Coping Techniques  Building Self Esteem:    The focus of this group was to determine what unhealthy coping techniques typically are used or and what healthy coping techniques would be helpful in coping with various problems. Patients were guided in becoming aware of the differences between healthy and unhealthy coping techniques.  Vignettes were used to generate ideas, which led to a discussion about personal application.     Participation Level:  Active  Participation Quality:  Attentive  Affect:  Blunted  Cognitive:  Appropriate  Insight: Improving  Engagement in Group:  Developing/Improving  Modes of Intervention:  Exercise, Discussion and Support  Additional Comments:  The patient expressed little during group, only sharing that he uses basketball as a healthy coping skill.  Ambrose Mantle, LCSW 10/02/2016   12:21 PM

## 2016-10-02 NOTE — Progress Notes (Signed)
D: Pt A & O X4. Denies SI, HI, AVH and pain when assessed. Pt is preoccupied about d/c "I just want to know when I'm going home" throughout this shift. Presents anxious / fidgety but is pleasant. Rates his depression, hopelessness and anxiety all 0/10 on self inventory sheet.  A: Scheduled medication administered as per MD's order. Encouraged pt to voice concerns and comply with treatment regimen including unit groups. Safety checks maintained at Q 15 minutes intervals without incident. R: Pt compliant with medications when offered. Denies adverse drug reactions this shift. Pt attended unit group as encouraged and was engaged. Tolerated all PO intake well. Remains safe on and off unit. POC continues for mood stability.

## 2016-10-02 NOTE — Progress Notes (Signed)
Pt reports he has had a good day.  He denies SI/HI/AVH.  He tells Clinical research associate that he would like to be discharged tomorrow to go to a family event to celebrate his father's birthday.  He voices no needs or concerns.  Later in the shift, there was a disagreement with the patients over the TV, and this pt became anxious about the situation.  He handled it well and appropriately.  He was going to try to go to bed without a sleep aid, but he was still anxious over the TV incident that he requested a Trazodone prn which was given to him.  Pt is pleasant and cooperative.  He makes his needs known to staff.  Support and encouragement offered.  Discharge plans are in process.  Pt was told to voice his concerns about a Saturday discharge to the doctor tomorrow morning.  Safety maintained with q15 minute checks.

## 2016-10-03 MED ORDER — FLUOXETINE HCL 10 MG PO CAPS: 10.0000 mg | ORAL_CAPSULE | Freq: Every day | ORAL | 0 refills | Status: AC

## 2016-10-03 MED ORDER — TRAZODONE HCL 50 MG PO TABS: 50.0000 mg | ORAL_TABLET | Freq: Every evening | ORAL | 0 refills | Status: AC | PRN

## 2016-10-03 MED ORDER — HYDROXYZINE HCL 25 MG PO TABS: 25.0000 mg | ORAL_TABLET | Freq: Four times a day (QID) | ORAL | 0 refills | Status: AC | PRN

## 2016-10-03 NOTE — Progress Notes (Addendum)
D: Pt at the time of assessment was alert and oriented x 4. Pt at the time denied depression, anxiety, pain, AVH, SI or HI. Pt was preoccupied about going home; states, "I feel ready; I was a little disappointed that the doctor did not let me leave today." Pt observed interacting with peers. A: Medications offered as prescribed. All patient's questions and concerns addressed. Support, encouragement, and safe environment provided. Will continue to monitor for any changes. 15-minute safety checks continue. R: Pt was med compliant. Pt attended wrap-up group. Safety checks continue.
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
## 2016-10-03 NOTE — Progress Notes (Signed)
Patient attended group and said that his day was a  9.  He used coloring, puzzling as his coping skills.

## 2016-10-03 NOTE — [facility] Group Notes (Signed)
[facility] LCSW Group Therapy Note     10/03/2016 at 10 AM   Type of Therapy and Topic: Group Therapy: Feelings Around Returning Home & Establishing a Supportive Framework and Activity to Identify signs of Improvement or Decompensation   Participation Level: Active    Description of Group:  Patients first processed thoughts and feelings about up coming discharge. These included fears of upcoming changes, lack of change, new living environments, judgements and expectations from others and overall stigma of MH issues. We then discussed what is a supportive framework? What does it look like feel like and how do I discern it from and unhealthy non-supportive network? Learn how to cope when supports are not helpful and don't support you. Discuss what to do when your family/friends are not supportive.   Therapeutic Goals Addressed in Processing Group:  1. Patient will identify one healthy supportive network that they can use at discharge. 2. Patient will identify one factor of a supportive framework and how to tell it from an unhealthy network. 3. Patient able to identify one coping skill to use when they do not have positive supports from others. 4. Patient will demonstrate ability to communicate their needs through discussion and/or role plays.  Summary of Patient Progress:  Pt engaged easily during group session. As patients processed their anxiety about discharge and described healthy supports patient  Shared that he frequently has difficulty making it out of bed  "much less making it out of the house." Patient chose a visual to represent decompensation as isolation and improvement as meditation. Patient identified need for more positive supports.   Carney Bern, LCSW

## 2016-10-03 NOTE — Progress Notes (Signed)
D:  Patient's self inventory sheet, patient has good sleep, sleep medication helpful.  Good appetite, normal energy level, good concentration.  Denied depression, hopeless and anxiety.  Denied withdrawals.  Denied SI.  Denied physical problems.  Denied pain.  Goal is leaving.  Plans to talk to MD.  Does have discharge plans. A:  Medications administered per MD orders.  Emotional support and encouragement given patient. R:  Denied SI and HI, contracts for safety.  Denied A/V hallucinations.  Safety maintained with 15 minute checks.

## 2016-10-03 NOTE — Progress Notes (Signed)
Discharge Note:  Patient discharged home with sister.  Patient denied SI and HI.  Denied A/V hallucinations.  Suicide prevention information given and discussed with patient who stated he understood and had no questions.  Patient stated he received all his belongings.  Patient stated he appreciated all assistance received from [facility] staff.  All required discharge information given to patient at discharge.

## 2016-10-03 NOTE — Discharge Summary (Signed)
Physician Discharge Summary Note  Patient:  Robert Sosa is an 21 y.o., male MRN:  [id number] DOB:  [birth year] Patient phone:  [phone] (home)  Patient address:   [address] [place] [address],  Total Time spent with patient: 30 minutes  Date of Admission:  09/29/2016 Date of Discharge: 10/03/2016  Reason for Admission: Per H&P- 34 old single male, who reports worsening depression over recent weeks. He cannot identify any acute stressors. Reports neuro-vegetative symptoms, including anhedonia, pervasive sadness, and over recent days had been experiencing suicidal ideations, with thoughts of hanging self . States he actually tied a noose, but decided against it and told his sister, who encouraged him to come to the hospital.  Principal Problem: Major depressive disorder, recurrent severe without psychotic features [facility]) Discharge Diagnoses: Patient Active Problem List   Diagnosis Date Noted  . Major depressive disorder, recurrent severe without psychotic features (HCC) [F33.2] 09/29/2016    Past Psychiatric History:   Past Medical History: History reviewed. No pertinent past medical history. History reviewed. No pertinent surgical history. Family History: History reviewed. No pertinent family history. Family Psychiatric  History:  Social History:  History  Alcohol Use No     History  Drug use: Unknown    Social History   Social History  . Marital status: Single    Spouse name: N/A  . Number of children: N/A  . Years of education: N/A   Social History Main Topics  . Smoking status: Never Smoker  . Smokeless tobacco: Never Used  . Alcohol use No  . Drug use: Unknown  . Sexual activity: Yes    Birth control/ protection: Condom   Other Topics Concern  . None   Social History Narrative  . None    Hospital Course:  Robert Sosa was admitted for Major depressive disorder, recurrent severe without psychotic features (HCC) and crisis management.  Pt was  treated discharged with the medications listed below under Medication List.  Medical problems were identified and treated as needed.  Home medications were restarted as appropriate. Improvement was monitored by observation and Robert Sosa 's daily report of symptom reduction.  Emotional and mental status was monitored by daily self-inventory reports completed by Robert Sosa and clinical staff.         Robert Sosa was evaluated by the treatment team for stability and plans for continued recovery upon discharge. Robert Sosa 's motivation was an integral factor for scheduling further treatment. Employment, transportation, bed availability, health status, family support, and any pending legal issues were also considered during hospital stay. Pt was offered further treatment options upon discharge including but not limited to Residential, Intensive Outpatient, and Outpatient treatment.  Robert Sosa will follow up with the services as listed below under Follow Up Information.     Upon completion of this admission the patient was both mentally and medically stable for discharge denying suicidal/homicidal ideation, auditory/visual/tactile hallucinations, delusional thoughts and paranoia.    Robert Sosa responded well to treatment with Prozac 10 mg, and Trazodone 50 mg  without adverse effects. Pt demonstrated improvement without reported or observed adverse effects to the point of stability appropriate for outpatient management. Pertinent labs include: CBC and CMP  for which outpatient follow-up is necessary for lab recheck as mentioned below. Reviewed CBC, CMP, BAL, and UDS; all unremarkable aside from noted exceptions.   Physical Findings: AIMS: Facial and Oral Movements Muscles of Facial Expression: None, normal Lips and Perioral Area: None, normal Jaw: None, normal Tongue: None, normal,Extremity  Movements Upper (arms, wrists, hands, fingers): None, normal Lower (legs, knees, ankles,  toes): None, normal, Trunk Movements Neck, shoulders, hips: None, normal, Overall Severity Severity of abnormal movements (highest score from questions above): None, normal Incapacitation due to abnormal movements: None, normal Patient's awareness of abnormal movements (rate only patient's report): No Awareness, Dental Status Current problems with teeth and/or dentures?: No Does patient usually wear dentures?: No  CIWA:  CIWA-Ar Total: 1 COWS:     Musculoskeletal: Strength & Muscle Tone: within normal limits Gait & Station: normal Patient leans: N/A  Psychiatric Specialty Exam: Physical Exam  Constitutional: He is oriented to person, place, and time.  Neurological: He is oriented to person, place, and time.  Psychiatric: He has a normal mood and affect. His behavior is normal.    Review of Systems  Psychiatric/Behavioral: Negative for depression (stable). The patient is not nervous/anxious.     Blood pressure 118/64, pulse 85, temperature 98.3 F (36.8 C), resp. rate 18, height  (1.778 m), weight 59.4 kg (131 lb), SpO2 99 %.Body mass index is 18.8 kg/m.   Have you used any form of tobacco in the last 30 days? (Cigarettes, Smokeless Tobacco, Cigars, and/or Pipes): No  Has this patient used any form of tobacco in the last 30 days? (Cigarettes, Smokeless Tobacco, Cigars, and/or Pipes) No  Blood Alcohol level:  Lab Results  Component Value Date   ETH <5 09/29/2016    Metabolic Disorder Labs:  No results found for: HGBA1C, MPG No results found for: PROLACTIN No results found for: CHOL, TRIG, HDL, CHOLHDL, VLDL, LDLCALC  See Psychiatric Specialty Exam and Suicide Risk Assessment completed by Attending Physician prior to discharge.  Discharge destination:  Home  Is patient on multiple antipsychotic therapies at discharge:  No   Has Patient had three or more failed trials of antipsychotic monotherapy by history:  No  Recommended Plan for Multiple Antipsychotic  Therapies: NA  Discharge Instructions    Diet - low sodium heart healthy    Complete by:  As directed    Discharge instructions    Complete by:  As directed    Take all medications as prescribed. Keep all follow-up appointments as scheduled.  Do not consume alcohol or use illegal drugs while on prescription medications. Report any adverse effects from your medications to your primary care provider promptly.  In the event of recurrent symptoms or worsening symptoms, call 911, a crisis hotline, or go to the nearest emergency department for evaluation.   Increase activity slowly    Complete by:  As directed      Allergies as of 10/03/2016   No Known Allergies     Medication List    STOP taking these medications   ibuprofen 600 MG tablet Commonly known as:  ADVIL,MOTRIN     TAKE these medications     Indication  FLUoxetine 10 MG capsule Commonly known as:  PROZAC Take 1 capsule (10 mg total) by mouth daily. Start taking on:  10/04/2016  Indication:  Depression   hydrOXYzine 25 MG tablet Commonly known as:  ATARAX/VISTARIL Take 1 tablet (25 mg total) by mouth every 6 (six) hours as needed for anxiety.  Indication:  Anxiety Neurosis   traZODone 50 MG tablet Commonly known as:  DESYREL Take 1 tablet (50 mg total) by mouth at bedtime as needed for sleep.  Indication:  Anxiety Disorder      Follow-up Information    [facility] Follow up on 10/13/2016.   Specialty:  Behavioral Health Why:  Appt on Wednesay, Oct 13, 2016 for counseling at 11:00AM with Vilma Prader. Please arrive 15 minutes early with insurance card to complete new patient paperwork. Your medication management appt will be scheduled at this visit. Thank you.  Contact information: [address] [place] [address] [phone]           Follow-up recommendations:  Activity:  as tolerated Diet:  heart healthy  Comments:  Take all medications as prescribed. Keep all follow-up appointments  as scheduled.  Do not consume alcohol or use illegal drugs while on prescription medications. Report any adverse effects from your medications to your primary care provider promptly.  In the event of recurrent symptoms or worsening symptoms, call 911, a crisis hotline, or go to the nearest emergency department for evaluation.   Signed: Oneta Rack, NP 10/03/2016, 10:23 AM

## 2018-10-25 ENCOUNTER — Emergency Department: Payer: 59

## 2018-10-25 ENCOUNTER — Encounter: Payer: Self-pay | Admitting: Emergency Medicine

## 2018-10-25 ENCOUNTER — Emergency Department
Admission: EM | Admit: 2018-10-25 | Discharge: 2018-10-25 | Disposition: A | Discharge status: Home / Self Care | Payer: 59 | Attending: Emergency Medicine | Admitting: Emergency Medicine

## 2018-10-25 ENCOUNTER — Other Ambulatory Visit: Payer: Self-pay

## 2018-10-25 DIAGNOSIS — Y9339 Activity, other involving climbing, rappelling and jumping off: Secondary | ICD-10-CM | POA: Insufficient documentation

## 2018-10-25 DIAGNOSIS — S92901A Unspecified fracture of right foot, initial encounter for closed fracture: Secondary | ICD-10-CM

## 2018-10-25 DIAGNOSIS — S93324A Dislocation of tarsometatarsal joint of right foot, initial encounter: Secondary | ICD-10-CM | POA: Diagnosis not present

## 2018-10-25 DIAGNOSIS — Z79899 Other long term (current) drug therapy: Secondary | ICD-10-CM | POA: Insufficient documentation

## 2018-10-25 DIAGNOSIS — S92341A Displaced fracture of fourth metatarsal bone, right foot, initial encounter for closed fracture: Secondary | ICD-10-CM | POA: Diagnosis not present

## 2018-10-25 DIAGNOSIS — Y929 Unspecified place or not applicable: Secondary | ICD-10-CM | POA: Insufficient documentation

## 2018-10-25 DIAGNOSIS — Y999 Unspecified external cause status: Secondary | ICD-10-CM | POA: Insufficient documentation

## 2018-10-25 DIAGNOSIS — W108XXA Fall (on) (from) other stairs and steps, initial encounter: Secondary | ICD-10-CM | POA: Diagnosis not present

## 2018-10-25 DIAGNOSIS — S99921A Unspecified injury of right foot, initial encounter: Secondary | ICD-10-CM | POA: Diagnosis present

## 2018-10-25 DIAGNOSIS — S93325A Dislocation of tarsometatarsal joint of left foot, initial encounter: Secondary | ICD-10-CM

## 2018-10-25 MED ORDER — HYDROCODONE-ACETAMINOPHEN 5-325 MG PO TABS: 1.0000 | ORAL_TABLET | Freq: Three times a day (TID) | ORAL | 0 refills | Status: AC | PRN

## 2018-10-25 MED ORDER — HYDROCODONE-ACETAMINOPHEN 5-325 MG PO TABS
1.0000 | ORAL_TABLET | Freq: Once | ORAL | Status: AC
  Administered 2018-10-25: 1 via ORAL
  Filled 2018-10-25: qty 1

## 2018-10-25 NOTE — ED Notes (Signed)
Right foot swollen. When moving from wheel chair to bed pt hopped on left foot, not placing any weight onto the injured foot. States he tripped when going down stairs landing onto the right foot.

## 2018-10-25 NOTE — Discharge Instructions (Signed)
Keep the cast clean and dry. Rest with the foot elevated when seated. Take the prescription pain medicine as directed. Call Dr. Alberteen Spindle for an appointment time, in the morning.

## 2018-10-25 NOTE — ED Triage Notes (Signed)
Pt here with c/o right foot pain after jumping down steps last night, states he is unable to put pressure on his foot.

## 2018-10-25 NOTE — ED Provider Notes (Signed)
[facility] Emergency Department Provider Note ____________________________________________  Time seen: 1013  I have reviewed the triage vital signs and the nursing notes.  HISTORY  Chief Complaint  Foot Injury  HPI Robert Sosa is a 23 y.o. male presents to the ED for evaluation of right foot pain, swelling, and disability.  Patient admits to mechanical injury when he jumped off of his porch last night skipping several steps.  He describes landing flat-footed on the right foot, and experienced immediate pain and disability.  He presents today with increased swelling to the dorsum of the foot and pain as well but he denies any other injury at this time.  History reviewed. No pertinent past medical history.  Patient Active Problem List   Diagnosis Date Noted  . Major depressive disorder, recurrent severe without psychotic features (HCC) 09/29/2016    History reviewed. No pertinent surgical history.  Prior to Admission medications   Medication Sig Start Date End Date Taking? Authorizing Provider  FLUoxetine (PROZAC) 10 MG capsule Take 1 capsule (10 mg total) by mouth daily. 10/04/16   Oneta RackLewis, Tanika N, NP  HYDROcodone-acetaminophen (NORCO) 5-325 MG tablet Take 1 tablet by mouth 3 (three) times daily as needed for moderate pain. 10/25/18   Johneisha Broaden, Charlesetta IvoryJenise V Bacon, PA-C  hydrOXYzine (ATARAX/VISTARIL) 25 MG tablet Take 1 tablet (25 mg total) by mouth every 6 (six) hours as needed for anxiety. 10/03/16   Oneta RackLewis, Tanika N, NP  traZODone (DESYREL) 50 MG tablet Take 1 tablet (50 mg total) by mouth at bedtime as needed for sleep. 10/03/16   Oneta RackLewis, Tanika N, NP    Allergies Patient has no known allergies.  No family history on file.  Social History Social History   Tobacco Use  . Smoking status: Never Smoker  . Smokeless tobacco: Never Used  Substance Use Topics  . Alcohol use: No  . Drug use: Not on file    Review of Systems  Constitutional: Negative for  fever. Eyes: Negative for visual changes. ENT: Negative for sore throat. Cardiovascular: Negative for chest pain. Respiratory: Negative for shortness of breath. Gastrointestinal: Negative for abdominal pain, vomiting and diarrhea. Genitourinary: Negative for dysuria. Musculoskeletal: Negative for back pain.  Right foot pain and swelling as above. Skin: Negative for rash. Neurological: Negative for headaches, focal weakness or numbness. ____________________________________________  PHYSICAL EXAM:  VITAL SIGNS: ED Triage Vitals  Enc Vitals Group     BP 10/25/18 0939 126/85     Pulse Rate 10/25/18 0939 64     Resp 10/25/18 0939 16     Temp 10/25/18 0939 98.9 F (37.2 C)     Temp Source 10/25/18 0939 Oral     SpO2 10/25/18 0939 99 %     Weight 10/25/18 0940 140 lb (63.5 kg)     Height 10/25/18 0940 5\' 11"  (1.803 m)     Head Circumference --      Peak Flow --      Pain Score 10/25/18 0939 10     Pain Loc --      Pain Edu? --      Excl. in GC? --     Constitutional: Alert and oriented. Well appearing and in no distress. Head: Normocephalic and atraumatic. Eyes: Conjunctivae are normal. Normal extraocular movements Cardiovascular: Normal rate, regular rhythm. Normal distal pulses and cap refill distally. Respiratory: Normal respiratory effort. No wheezes/rales/rhonchi. Musculoskeletal: Right foot with obvious dorsal soft tissue swelling noted.  Patient is tender to palpation over the midfoot.  His  ankle joint is intact and without effusion.  Normal range of motion of the ankle is elicited.  No calf or Achilles tenderness is appreciated.  Knee exam is also benign with normal range of motion without any signs of internal derangement.  Nontender with normal range of motion in all extremities.  Neurologic: Antalgic gait with nonweightbearing on the right foot. Normal speech and language. No gross focal neurologic deficits are appreciated. Skin:  Skin is warm, dry and intact. No rash  noted. Psychiatric: Mood and affect are normal. Patient exhibits appropriate insight and judgment. ____________________________________________   RADIOLOGY  Right Foot  Displaced 4th MT base fracture  CT Right Foot  IMPRESSION: 1. Lisfranc joint injury as described. There is a comminuted intra-articular fracture of the 4th metatarsal base which is moderately displaced and with associated widening of the 4th tarsometatarsal joint. In addition, there are mildly displaced fractures involving the lateral cuneiform, the 2nd metatarsal base and possibly the 3rd metatarsal base. 2. No significant subluxation at the Lisfranc joint. 3. Dorsal soft tissue swelling without focal hematoma. ____________________________________________  PROCEDURES  Procedures ____________________________________________  INITIAL IMPRESSION / ASSESSMENT AND PLAN / ED COURSE  Robert Sosa was evaluated in Emergency Department on 10/25/2018 for the symptoms described in the history of present illness. He was evaluated in the context of the global COVID-19 pandemic, which necessitated consideration that the patient might be at risk for infection with the SARS-CoV-2 virus that causes COVID-19. Institutional protocols and algorithms that pertain to the evaluation of patients at risk for COVID-19 are in a state of rapid change based on information released by regulatory bodies including the CDC and federal and state organizations. These policies and algorithms were followed during the patient's care in the ED.  ----------------------------------------- 11:13 AM on 10/25/2018 -----------------------------------------  S/w Dr. Alberteen Spindle (podiatry). He would like a CT scan for further investigation. He suggests splinting and NWB on crutches. He will see the patient in the office tomorrow, for further fracture care.  Patient with ED evaluation and initial fracture management of a Lisfrac injury including multiple MT  fractures. He is placed in a posterior OCL and advised to NWB on crutches. He will be seen by Dr. Alberteen Spindle tomorrow for further intervention. Prescriptions for Norco (#10) are provided.  ____________________________________________  FINAL CLINICAL IMPRESSION(S) / ED DIAGNOSES  Final diagnoses:  Foot fracture, right, closed, initial encounter  Lisfranc dislocation, left, initial encounter      Lissa Hoard, PA-C 10/25/18 1301    Jeanmarie Plant, MD 10/26/18 2359

## 2020-06-03 ENCOUNTER — Ambulatory Visit (HOSPITAL_COMMUNITY)
Admission: EM | Admit: 2020-06-03 | Discharge: 2020-06-03 | Disposition: A | Discharge status: Home / Self Care | Payer: 59

## 2020-06-03 ENCOUNTER — Other Ambulatory Visit: Payer: Self-pay

## 2020-06-03 NOTE — ED Notes (Signed)
Patient was called several times with no response.  

## 2021-05-18 IMAGING — CT CT OF THE RIGHT FOOT WITHOUT CONTRAST
2 of 3 series · 11 of 31 positions shown, 13 images · non-contrast
Comparison: Radiographs 10/25/2018.

CLINICAL DATA: Fourth metatarsal fracture sustained after jumping
down steps last night.

EXAM:
CT OF THE RIGHT FOOT WITHOUT CONTRAST
TECHNIQUE: Multidetector CT imaging of the right foot was performed according
to the standard protocol. Multiplanar CT image reconstructions were
also generated.

[Series 8: cor st · coronal · 0.29mm/px · 6 of 271 slices shown, 7 images]
[im 46/271  soft-tissue]
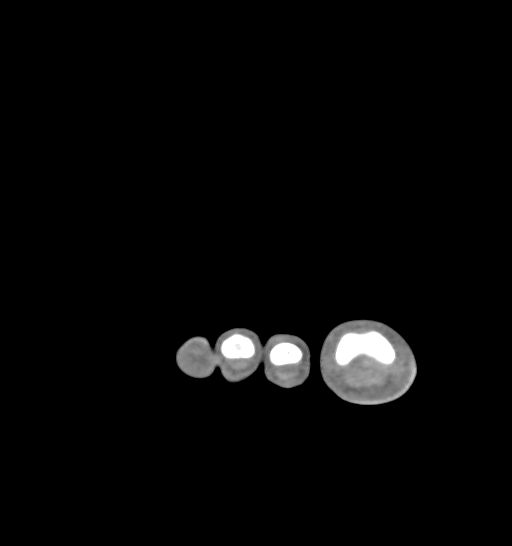
[im 46/271  bone]
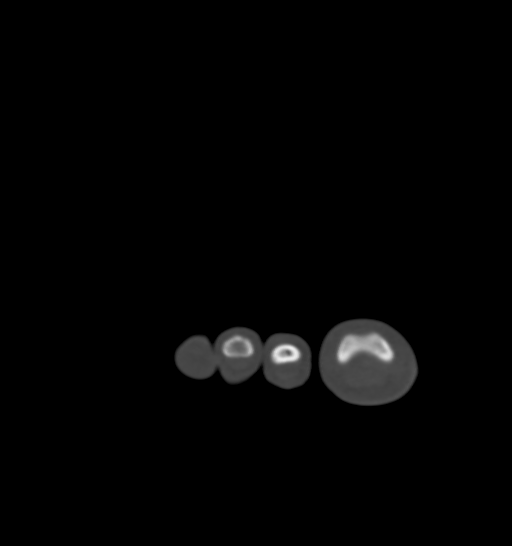
[im 91/271  bone]
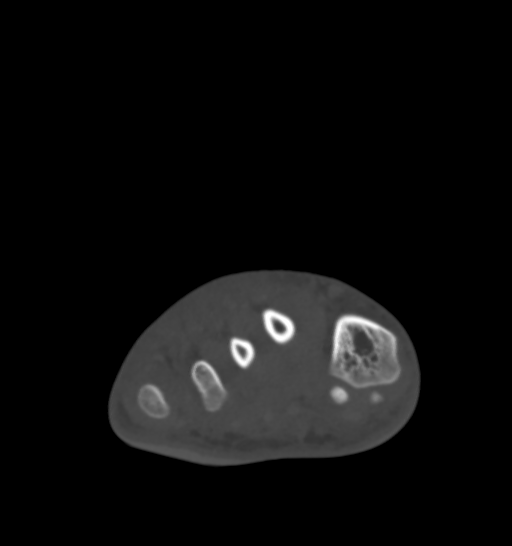
[im 125/271  bone]
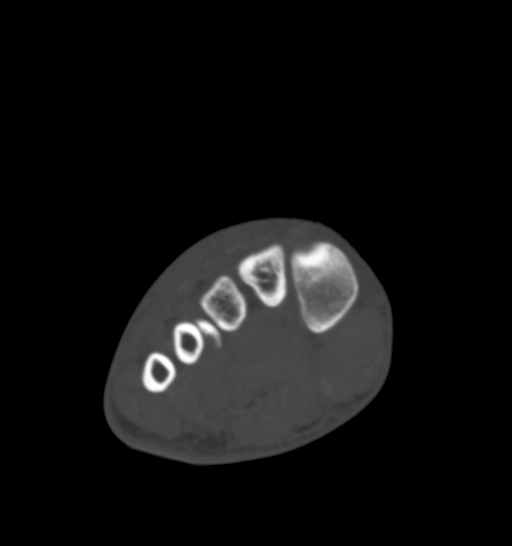
[im 136/271  bone]
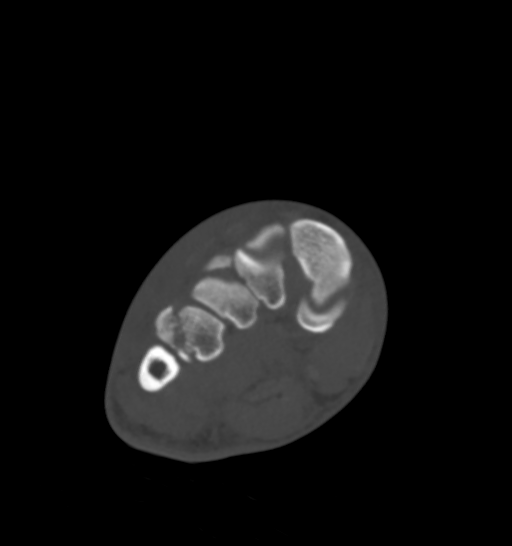
[im 162/271  bone]
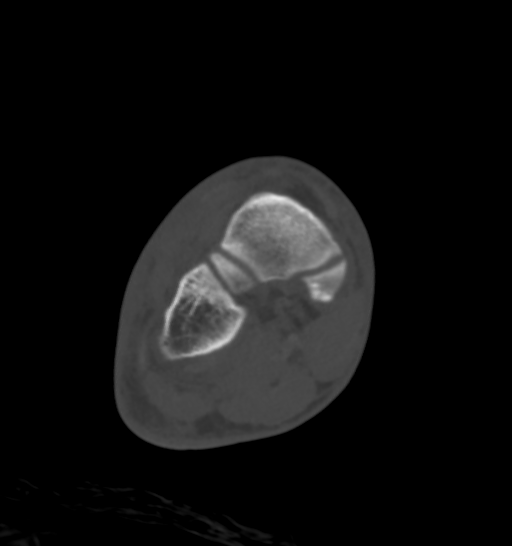
[im 226/271  bone]
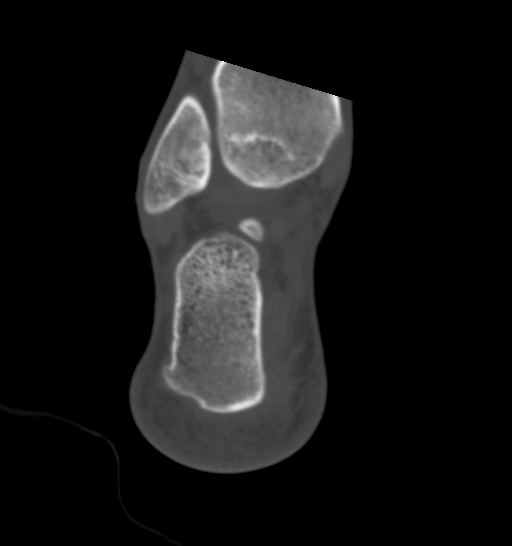

[Series 9: sag st · sagittal · 0.33mm/px · 5 of 107 slices shown, 6 images]
[im 36/107  bone]
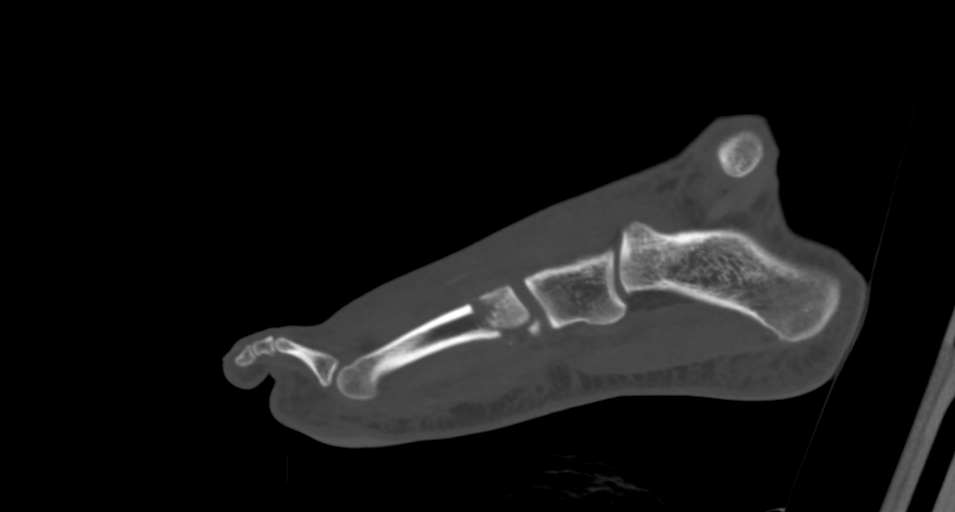
[im 45/107  bone]
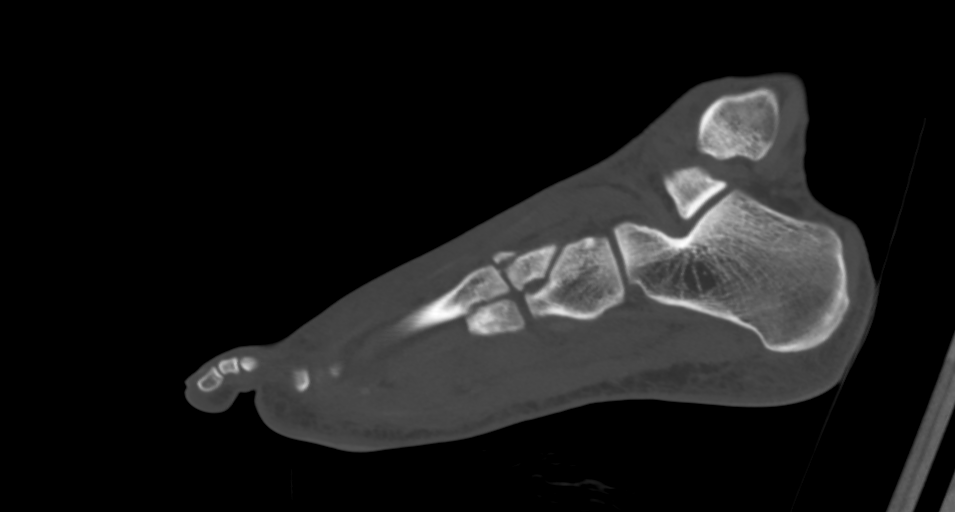
[im 54/107  soft-tissue]
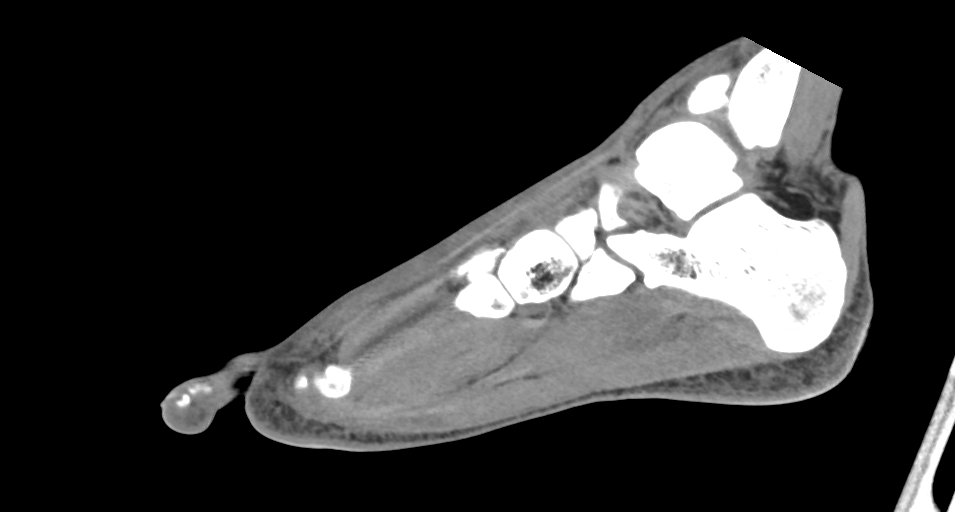
[im 54/107  bone]
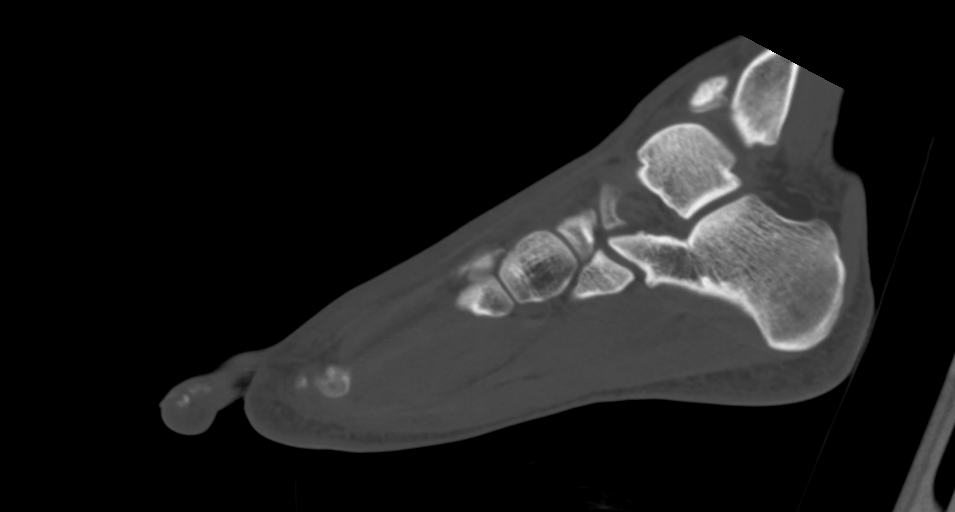
[im 62/107  bone]
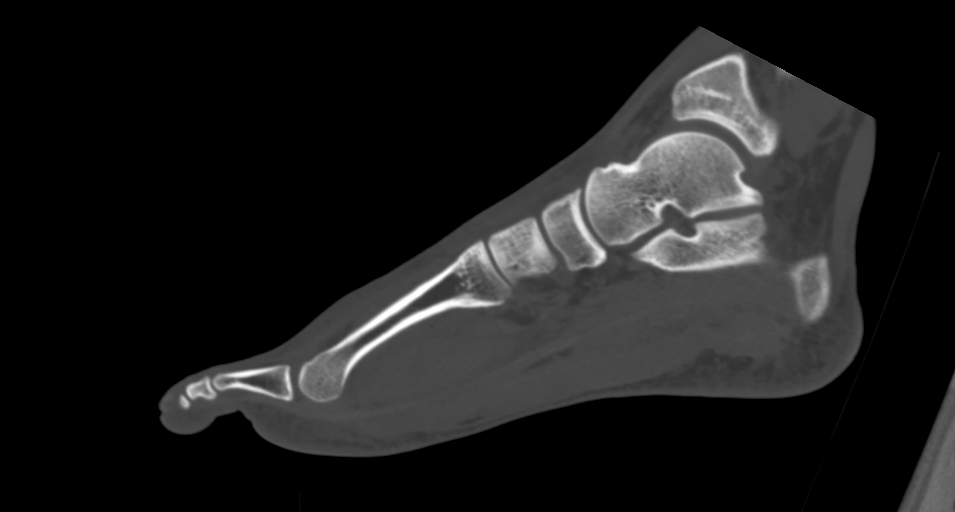
[im 71/107  bone]
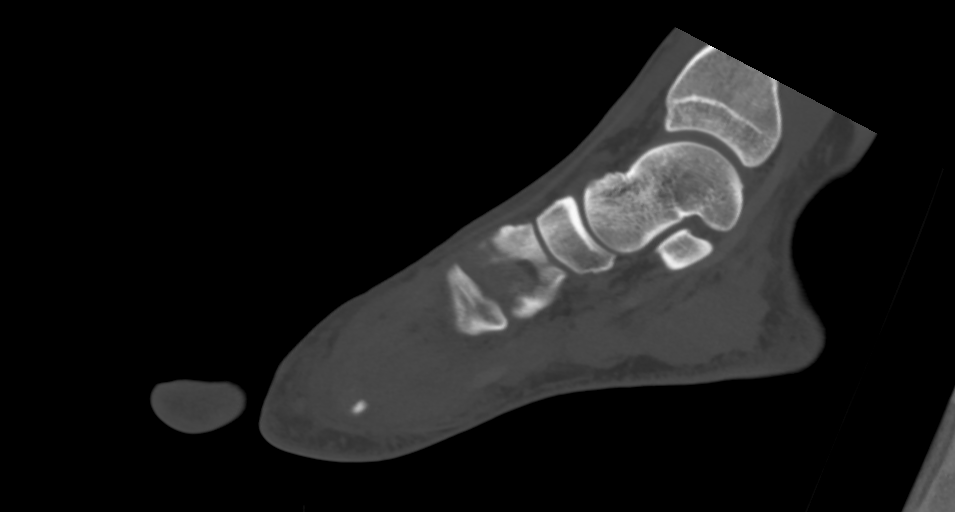

[11 of 31 positions shown; findings below may reference images not displayed]

FINDINGS: Bones/Joint/Cartilage

There is a comminuted intra-articular fracture involving the base of
the 4th metatarsal. This demonstrates up to 5 mm of lateral
displacement. At the proximal articular surface, there is up to 3 mm
of cortical displacement (image 91/4). The 4th tarsometatarsal
articulation is widened to 4 mm. There is no dislocation. There is a
mildly displaced fracture involving the dorsal aspect of the lateral
cuneiform, adjacent to the articulation with the 3rd metatarsal.
There may be a minimal fracture involving the 3rd metatarsal base.
There is also a small fracture involving the volar base of the 2nd
metatarsal.

The cuboid, middle and medial cuneiforms appear intact. The 1st and
5th metatarsals appear intact. The distal metatarsals, toes and
hindfoot demonstrate no acute findings. The tibial sesamoid of the
1st metatarsal is multipartite.

Ligaments

Suboptimally assessed by CT. However, the Lisfranc ligament appears
grossly intact. There is no subluxation at the 2nd tarsometatarsal
articulation.

Muscles and Tendons

Unremarkable.  No significant tenosynovitis.

Soft tissues

Dorsal soft tissue swelling in the midfoot and forefoot without
focal fluid collection. No evidence of foreign body.
IMPRESSION: 1. Lisfranc joint injury as described. There is a comminuted
intra-articular fracture of the 4th metatarsal base which is
moderately displaced and with associated widening of the 4th
tarsometatarsal joint. In addition, there are mildly displaced
fractures involving the lateral cuneiform, the 2nd metatarsal base
and possibly the 3rd metatarsal base.
2. No significant subluxation at the Lisfranc joint.
3. Dorsal soft tissue swelling without focal hematoma.

## 2021-05-18 IMAGING — DX RIGHT FOOT COMPLETE - 3+ VIEW
3 series · 3 of 3 positions shown · non-contrast
Comparison: None.

CLINICAL DATA: Foot pain after fall

EXAM:
RIGHT FOOT COMPLETE - 3+ VIEW

[foot ap]
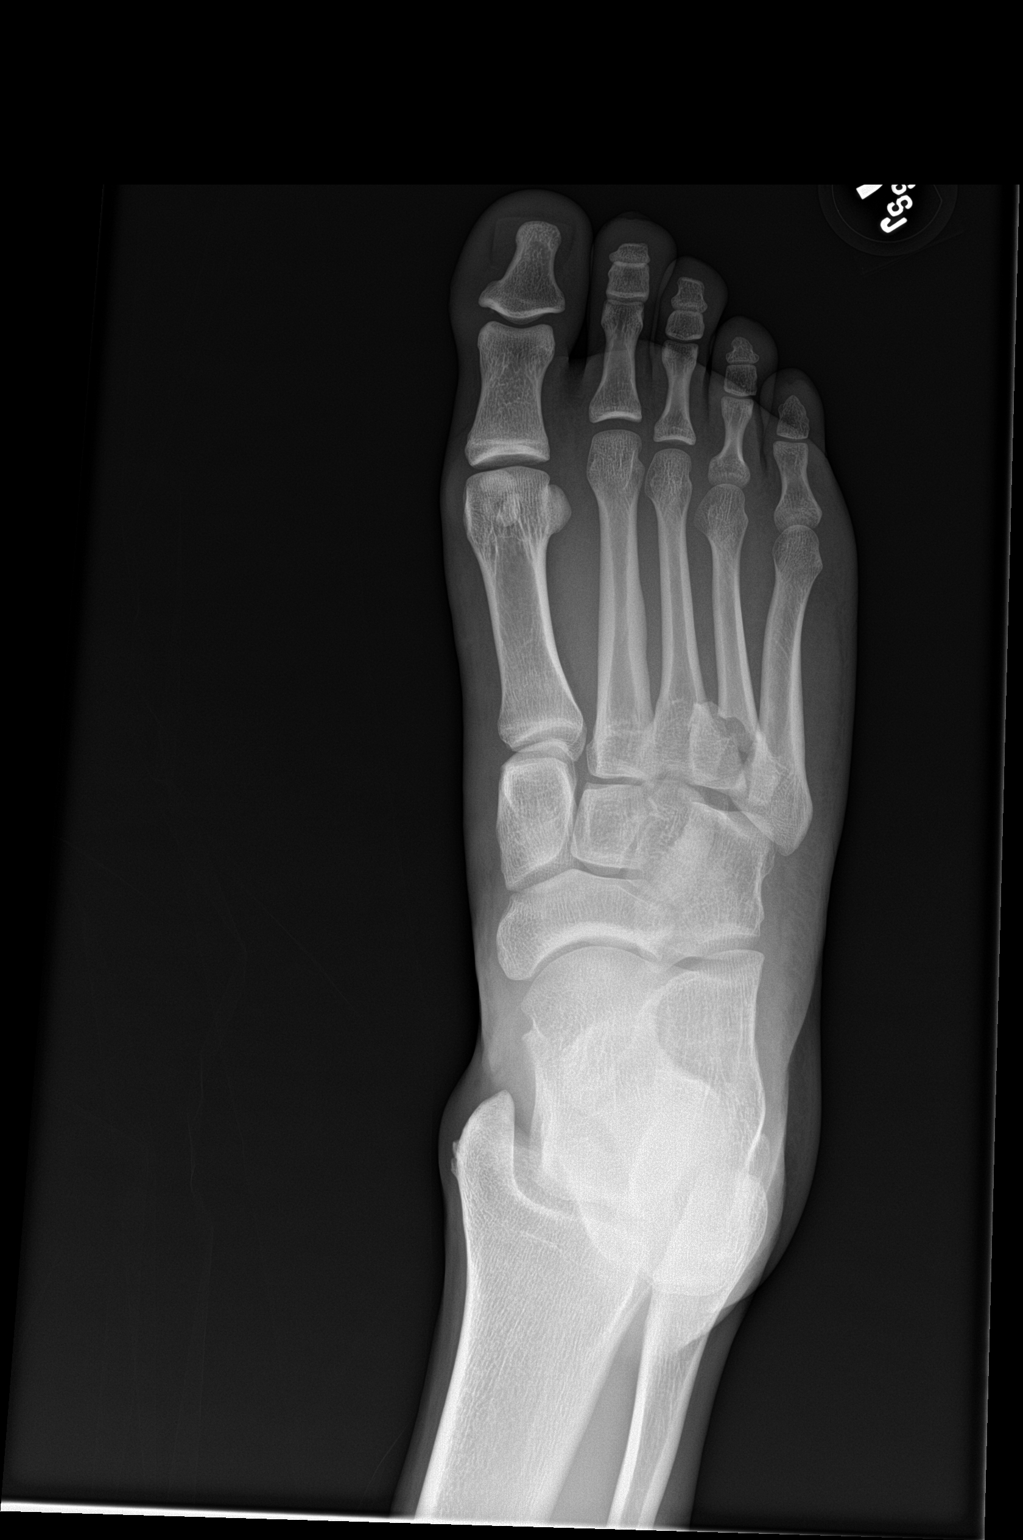

[foot obl]
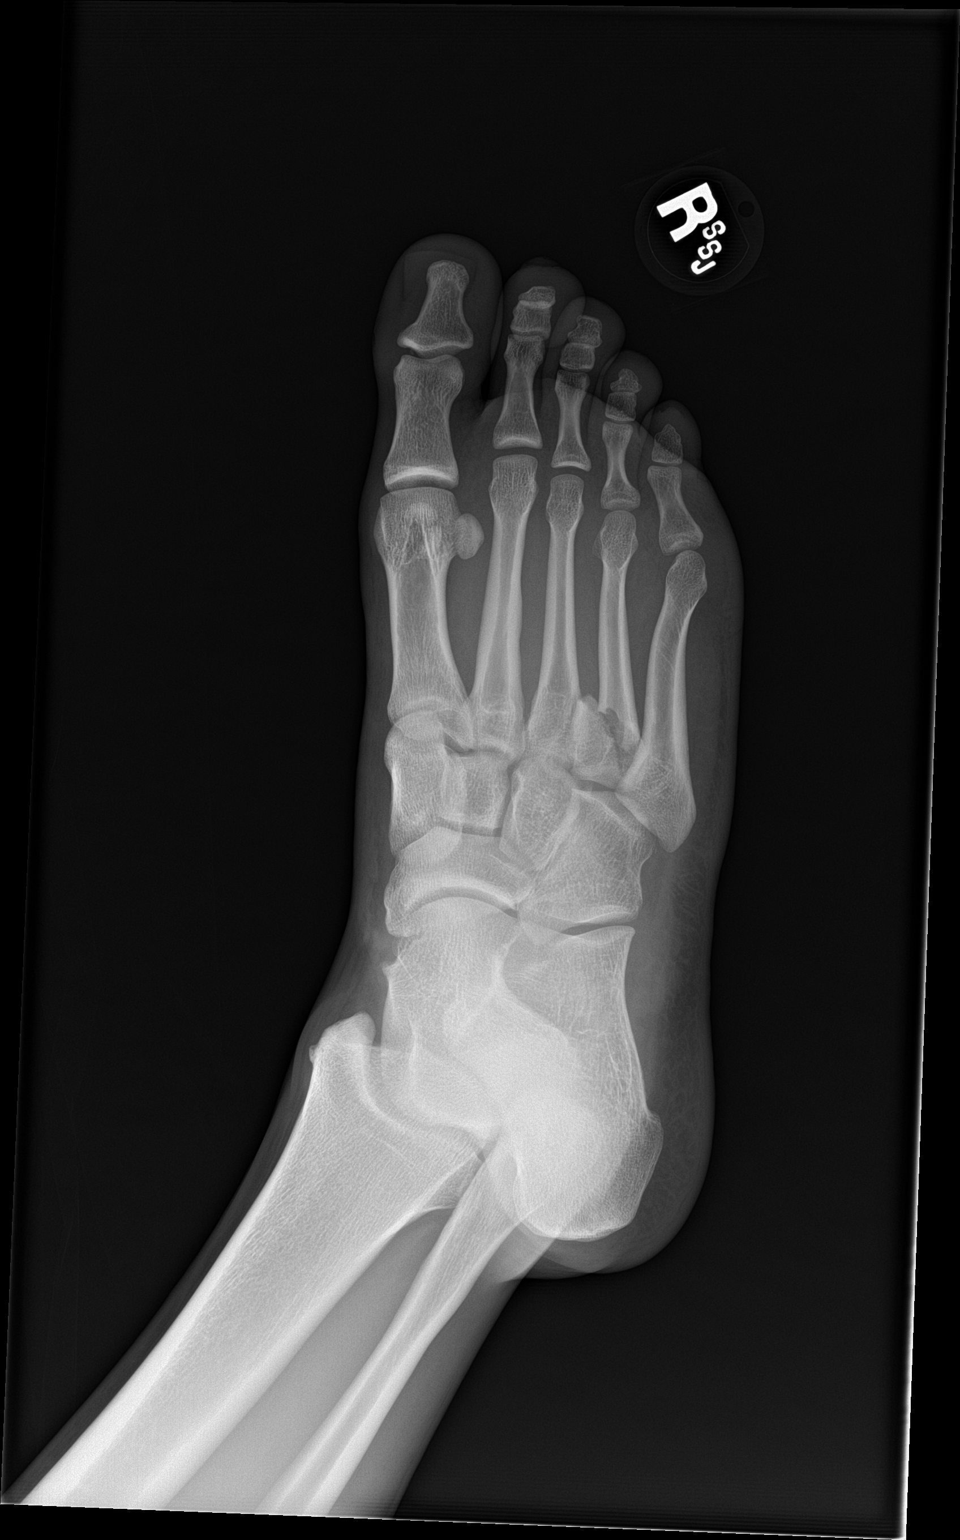

[foot lat]
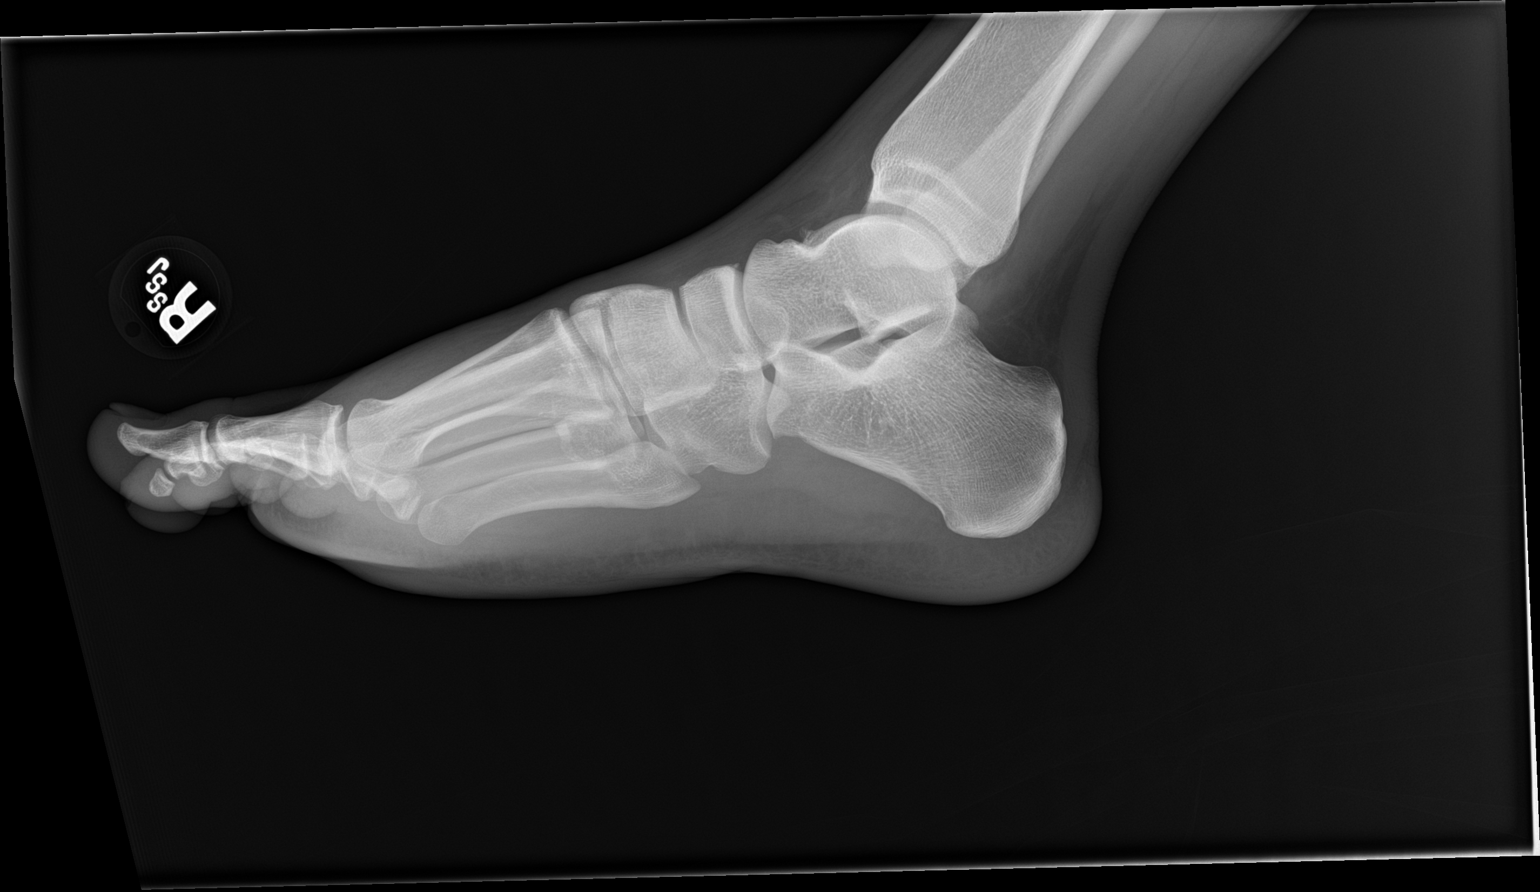

[3 of 3 positions shown; findings below may reference images not displayed]

FINDINGS: Proximal fourth metatarsal fracture at the junction of the shaft and
base, with displacement of the base. There may be widening at the
associated TMT joint.
IMPRESSION: Fourth proximal metatarsal fracture with displacement of the base
causing mild widening of the TMT joint.
